# Patient Record
Sex: Male | Born: 1965
Health system: Southern US, Community
[De-identification: ages and names within clinical notes are randomized; demographics above are authoritative.]

## PROBLEM LIST (undated history)

## (undated) DIAGNOSIS — M1990 Unspecified osteoarthritis, unspecified site: Secondary | ICD-10-CM

## (undated) DIAGNOSIS — H9190 Unspecified hearing loss, unspecified ear: Secondary | ICD-10-CM

## (undated) DIAGNOSIS — H269 Unspecified cataract: Secondary | ICD-10-CM

## (undated) DIAGNOSIS — K219 Gastro-esophageal reflux disease without esophagitis: Secondary | ICD-10-CM

## (undated) DIAGNOSIS — E785 Hyperlipidemia, unspecified: Secondary | ICD-10-CM

## (undated) HISTORY — DX: Hyperlipidemia, unspecified: E78.5

## (undated) HISTORY — DX: Unspecified osteoarthritis, unspecified site: M19.90

## (undated) HISTORY — PX: OTHER SURGICAL HISTORY: SHX169

## (undated) HISTORY — DX: Unspecified cataract: H26.9

## (undated) HISTORY — DX: Unspecified hearing loss, unspecified ear: H91.90

## (undated) HISTORY — DX: Gastro-esophageal reflux disease without esophagitis: K21.9

## (undated) HISTORY — PX: HAND SURGERY: SHX662

---

## 1999-11-17 ENCOUNTER — Encounter: Payer: Self-pay | Admitting: Emergency Medicine

## 1999-11-17 ENCOUNTER — Observation Stay (HOSPITAL_COMMUNITY): Admission: EM | Admit: 1999-11-17 | Discharge: 1999-11-18 | Payer: Self-pay | Admitting: Emergency Medicine

## 2008-11-15 DEATH — deceased

## 2012-09-02 ENCOUNTER — Other Ambulatory Visit (HOSPITAL_COMMUNITY): Payer: Self-pay | Admitting: Chiropractic Medicine

## 2012-09-02 ENCOUNTER — Ambulatory Visit (HOSPITAL_COMMUNITY)
Admission: RE | Admit: 2012-09-02 | Discharge: 2012-09-02 | Disposition: A | Discharge status: Home / Self Care | Payer: Self-pay | Source: Ambulatory Visit | Attending: Chiropractic Medicine | Admitting: Chiropractic Medicine

## 2012-09-02 DIAGNOSIS — R52 Pain, unspecified: Secondary | ICD-10-CM

## 2012-09-02 DIAGNOSIS — R079 Chest pain, unspecified: Secondary | ICD-10-CM | POA: Insufficient documentation

## 2014-02-26 IMAGING — CR DG RIBS W/ CHEST 3+V*R*
5 series · 5 of 5 positions shown · non-contrast
Comparison: None.

CLINICAL DATA: Right upper posterior rib pain for 1 month, no
history of injury

RIGHT RIBS AND CHEST - 3+ VIEW

[w chest pa]
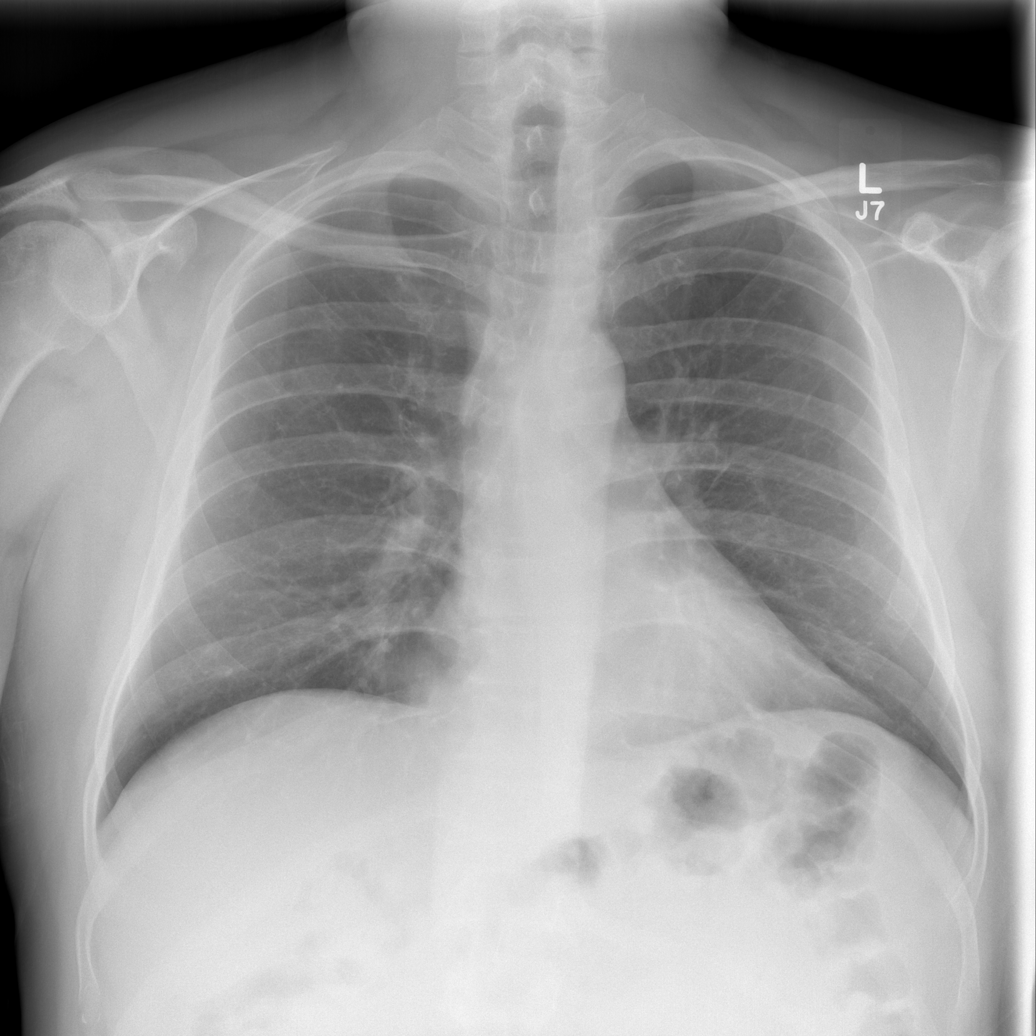

[w ribs ap/pa upper right * (1 of 2)]
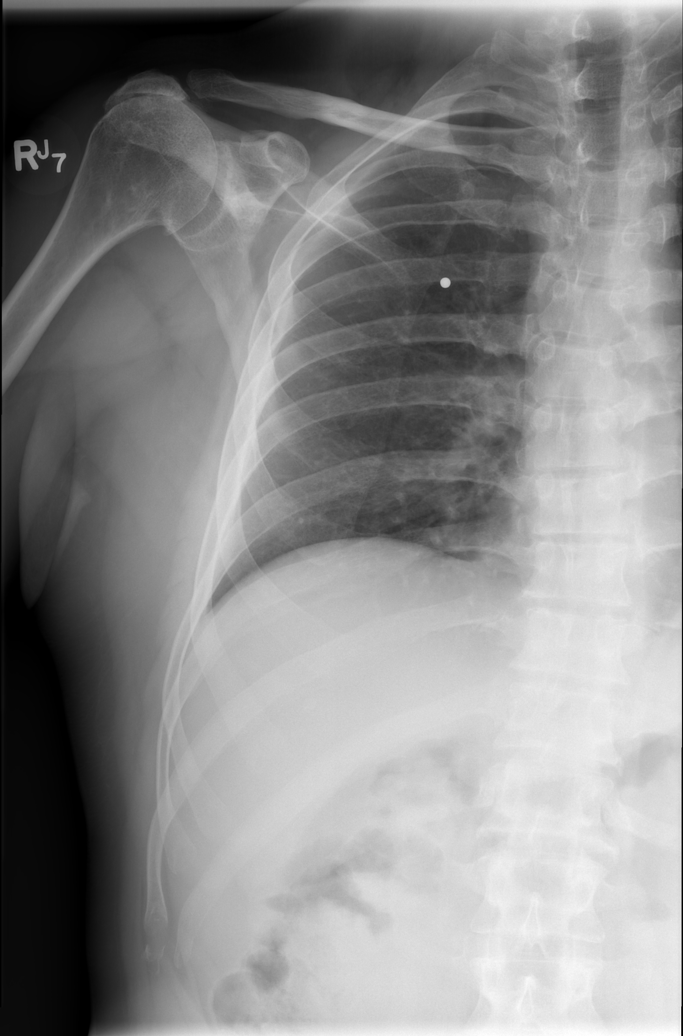

[w ribs ap/pa upper right * (2 of 2)]
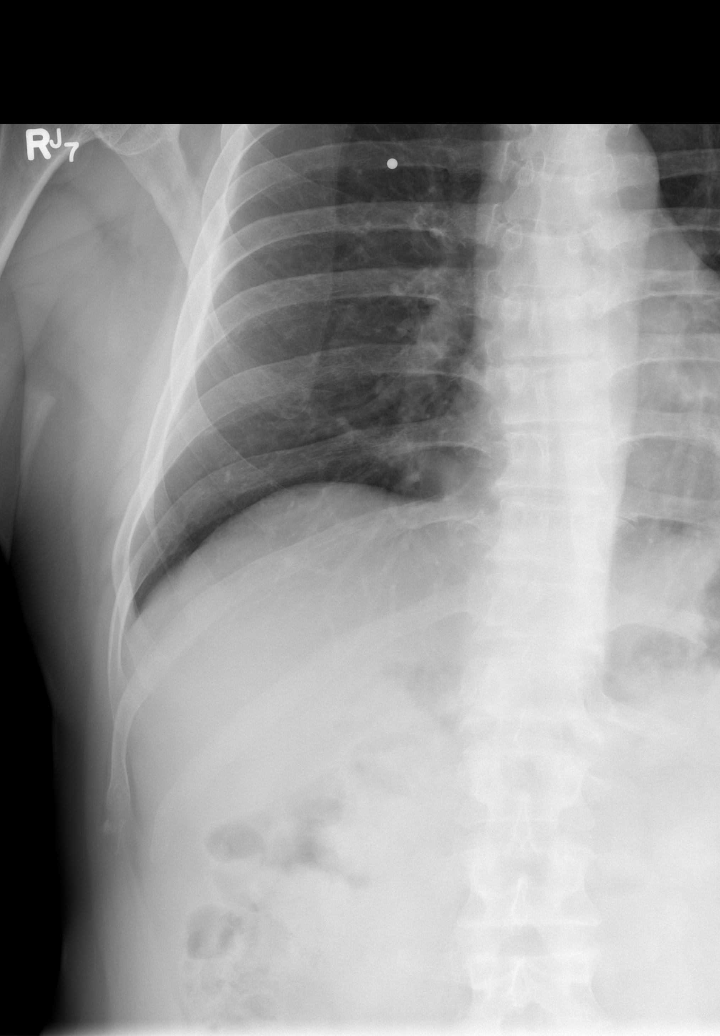

[w ribs oblique right * (1 of 2)]
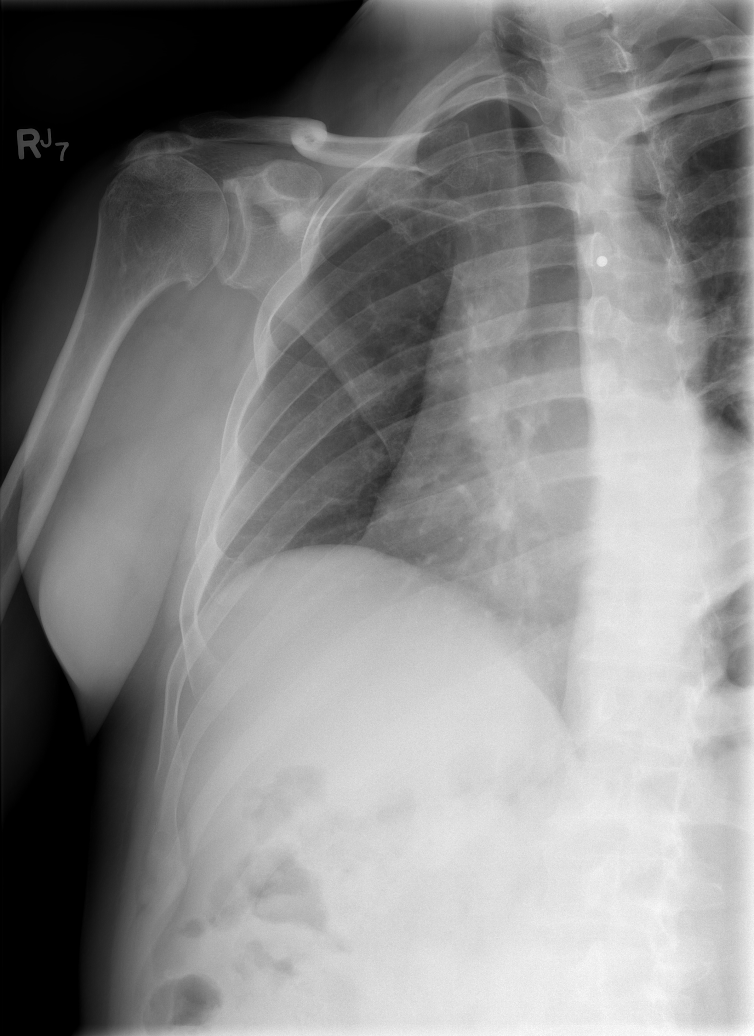

[w ribs oblique right * (2 of 2)]
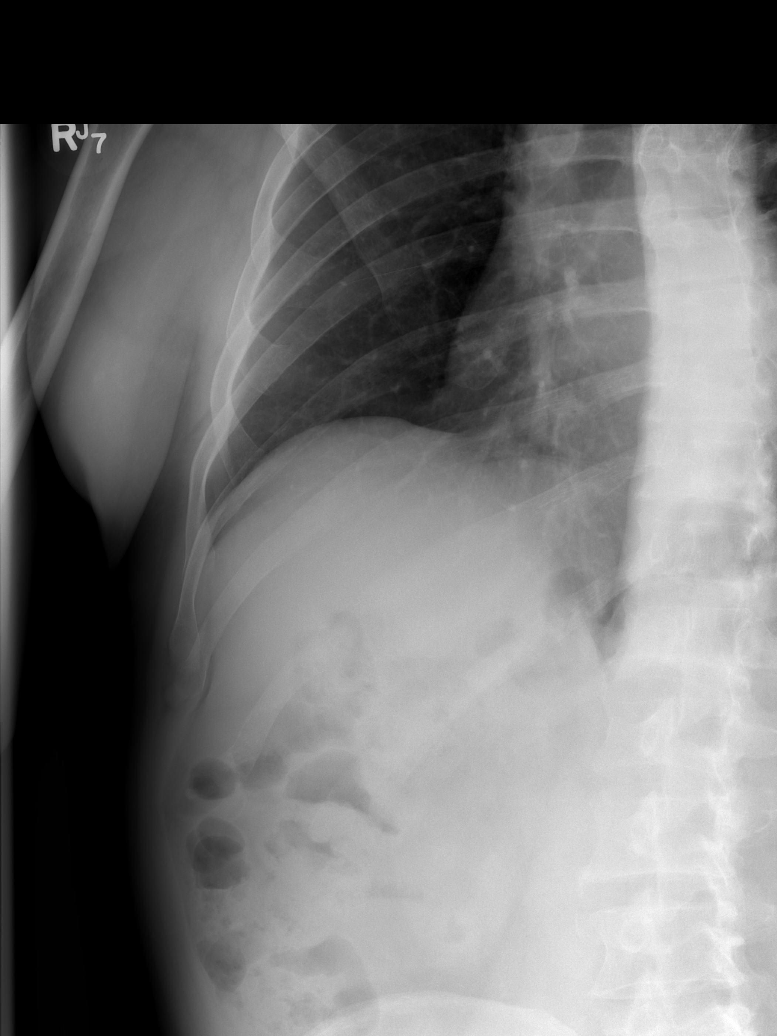

[5 of 5 positions shown; findings below may reference images not displayed]

FINDINGS: The lungs are clear.  Mediastinal contours appear normal.
The heart is within normal limits in size.

Right rib detail films show no acute abnormality of the ribs.
There is significant degenerative change however involving the
right shoulder with probable chronic rotator cuff disease.
IMPRESSION: 1.  No active lung disease.
2.  Negative right rib detail.
3.  Findings consistent with chronic rotator cuff disease on the
right.

## 2015-02-25 DIAGNOSIS — M25531 Pain in right wrist: Secondary | ICD-10-CM | POA: Diagnosis not present

## 2015-02-25 DIAGNOSIS — S6991XA Unspecified injury of right wrist, hand and finger(s), initial encounter: Secondary | ICD-10-CM | POA: Diagnosis not present

## 2015-07-05 DIAGNOSIS — R51 Headache: Secondary | ICD-10-CM | POA: Diagnosis not present

## 2015-07-05 DIAGNOSIS — L03317 Cellulitis of buttock: Secondary | ICD-10-CM | POA: Diagnosis not present

## 2015-07-05 DIAGNOSIS — R7303 Prediabetes: Secondary | ICD-10-CM | POA: Diagnosis not present

## 2015-08-09 DIAGNOSIS — L03818 Cellulitis of other sites: Secondary | ICD-10-CM | POA: Diagnosis not present

## 2016-01-25 DIAGNOSIS — S43402A Unspecified sprain of left shoulder joint, initial encounter: Secondary | ICD-10-CM | POA: Diagnosis not present

## 2016-01-25 DIAGNOSIS — S4992XA Unspecified injury of left shoulder and upper arm, initial encounter: Secondary | ICD-10-CM | POA: Diagnosis not present

## 2017-02-28 DIAGNOSIS — H903 Sensorineural hearing loss, bilateral: Secondary | ICD-10-CM | POA: Insufficient documentation

## 2021-08-30 ENCOUNTER — Encounter: Payer: Self-pay | Admitting: Family Medicine

## 2021-08-30 ENCOUNTER — Ambulatory Visit (INDEPENDENT_AMBULATORY_CARE_PROVIDER_SITE_OTHER): Payer: Medicare Other | Admitting: Family Medicine

## 2021-08-30 VITALS — BP 134/92 | HR 66 | Temp 96.7°F | Ht 71.5 in | Wt 215.4 lb

## 2021-08-30 DIAGNOSIS — Z96642 Presence of left artificial hip joint: Secondary | ICD-10-CM

## 2021-08-30 DIAGNOSIS — H903 Sensorineural hearing loss, bilateral: Secondary | ICD-10-CM

## 2021-08-30 DIAGNOSIS — R102 Pelvic and perineal pain: Secondary | ICD-10-CM | POA: Diagnosis not present

## 2021-08-30 DIAGNOSIS — N528 Other male erectile dysfunction: Secondary | ICD-10-CM

## 2021-08-30 DIAGNOSIS — W38XXXS Explosion and rupture of other specified pressurized devices, sequela: Secondary | ICD-10-CM | POA: Insufficient documentation

## 2021-08-30 DIAGNOSIS — E782 Mixed hyperlipidemia: Secondary | ICD-10-CM

## 2021-08-30 DIAGNOSIS — Z9889 Other specified postprocedural states: Secondary | ICD-10-CM | POA: Insufficient documentation

## 2021-08-30 DIAGNOSIS — Q549 Hypospadias, unspecified: Secondary | ICD-10-CM

## 2021-08-30 DIAGNOSIS — F5101 Primary insomnia: Secondary | ICD-10-CM

## 2021-08-30 DIAGNOSIS — M159 Polyosteoarthritis, unspecified: Secondary | ICD-10-CM

## 2021-08-30 DIAGNOSIS — Z809 Family history of malignant neoplasm, unspecified: Secondary | ICD-10-CM | POA: Insufficient documentation

## 2021-08-30 DIAGNOSIS — E1169 Type 2 diabetes mellitus with other specified complication: Secondary | ICD-10-CM | POA: Insufficient documentation

## 2021-08-30 DIAGNOSIS — R0683 Snoring: Secondary | ICD-10-CM

## 2021-08-30 DIAGNOSIS — Z779 Other contact with and (suspected) exposures hazardous to health: Secondary | ICD-10-CM | POA: Insufficient documentation

## 2021-08-30 HISTORY — DX: Hypospadias, unspecified: Q54.9

## 2021-08-30 HISTORY — DX: Presence of left artificial hip joint: Z96.642

## 2021-08-30 HISTORY — DX: Explosion and rupture of other specified pressurized devices, sequela: W38.XXXS

## 2021-08-30 HISTORY — DX: Other specified postprocedural states: Z98.890

## 2021-08-30 HISTORY — DX: Other contact with and (suspected) exposures hazardous to health: Z77.9

## 2021-08-30 HISTORY — DX: Pelvic and perineal pain: R10.2

## 2021-08-30 HISTORY — DX: Family history of malignant neoplasm, unspecified: Z80.9
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
## 2021-08-30 NOTE — Assessment & Plan Note (Signed)
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

## 2021-08-30 NOTE — Assessment & Plan Note (Signed)
Irritation, drainage, chronic ?History of trial of antifungals without improvement ?Follow-up in 1 month ?

## 2021-08-30 NOTE — Assessment & Plan Note (Signed)
Leading to traumatic surgical hands ?With ongoing hand pain ?Controlled on CBD OTC ?

## 2021-08-30 NOTE — Assessment & Plan Note (Signed)
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

## 2021-08-30 NOTE — Assessment & Plan Note (Signed)
Currently on rosuvastatin 10 mg ?Denies refills at this time ?Check lipid panel in 1 month ?

## 2021-08-30 NOTE — Assessment & Plan Note (Signed)
Associated with "stability" and other urologic and ED dysfunction ?Report no urinary signs associated with infection ?Follow-up at next appointment ?

## 2021-08-30 NOTE — Patient Instructions (Addendum)
It was a pleasure to meet you today Scott Shannon. ?We are referring to oncology because of exposure to cancer causing agents at  [place] ?Please follow-up 1 month so I can discuss your ongoing pain and cholesterol issues. ?

## 2021-08-30 NOTE — Assessment & Plan Note (Signed)
Says he was snoring, elevated BMI, concern for possible OSA ?Follow-up in 1 month to further explore ?

## 2021-09-06 ENCOUNTER — Ambulatory Visit: Payer: Medicare Other | Admitting: Family Medicine

## 2021-09-09 ENCOUNTER — Telehealth: Payer: Self-pay | Admitting: Family Medicine

## 2021-09-09 NOTE — Telephone Encounter (Signed)
Pt 's referral [phone] has closed the request. They do not see anyone with this Diagnosis.

## 2021-09-14 ENCOUNTER — Encounter: Payer: Self-pay | Admitting: Family Medicine

## 2021-09-14 ENCOUNTER — Ambulatory Visit (INDEPENDENT_AMBULATORY_CARE_PROVIDER_SITE_OTHER): Payer: Medicare Other | Admitting: Family Medicine

## 2021-09-14 VITALS — BP 153/87 | HR 82 | Temp 96.7°F | Ht 71.0 in | Wt 212.0 lb

## 2021-09-14 DIAGNOSIS — R102 Pelvic and perineal pain: Secondary | ICD-10-CM

## 2021-09-14 DIAGNOSIS — R079 Chest pain, unspecified: Secondary | ICD-10-CM

## 2021-09-14 DIAGNOSIS — L304 Erythema intertrigo: Secondary | ICD-10-CM

## 2021-09-14 DIAGNOSIS — R002 Palpitations: Secondary | ICD-10-CM | POA: Diagnosis not present

## 2021-09-14 LAB — CBC WITH DIFFERENTIAL/PLATELET
Basophils Absolute: 0.1 10*3/uL (ref 0.0–0.1)
Basophils Relative: 0.9 % (ref 0.0–3.0)
Eosinophils Absolute: 0.1 10*3/uL (ref 0.0–0.7)
Eosinophils Relative: 1.6 % (ref 0.0–5.0)
HCT: 46.9 % (ref 39.0–52.0)
Hemoglobin: 15.8 g/dL (ref 13.0–17.0)
Lymphocytes Relative: 27.4 % (ref 12.0–46.0)
Lymphs Abs: 2.4 10*3/uL (ref 0.7–4.0)
MCHC: 33.7 g/dL (ref 30.0–36.0)
MCV: 92.4 fl (ref 78.0–100.0)
Monocytes Absolute: 0.7 10*3/uL (ref 0.1–1.0)
Monocytes Relative: 7.9 % (ref 3.0–12.0)
Neutro Abs: 5.5 10*3/uL (ref 1.4–7.7)
Neutrophils Relative %: 62.2 % (ref 43.0–77.0)
Platelets: 286 10*3/uL (ref 150.0–400.0)
RBC: 5.08 Mil/uL (ref 4.22–5.81)
RDW: 13 % (ref 11.5–15.5)
WBC: 8.8 10*3/uL (ref 4.0–10.5)

## 2021-09-14 LAB — BASIC METABOLIC PANEL
BUN: 17 mg/dL (ref 6–23)
CO2: 23 mEq/L (ref 19–32)
Calcium: 10.2 mg/dL (ref 8.4–10.5)
Chloride: 100 mEq/L (ref 96–112)
Creatinine, Ser: 1.32 mg/dL (ref 0.40–1.50)
GFR: 60.25 mL/min (ref >60.00)
Glucose, Bld: 143 mg/dL — ABNORMAL HIGH (ref 70–99)
Potassium: 4.2 mEq/L (ref 3.5–5.1)
Sodium: 133 mEq/L — ABNORMAL LOW (ref 135–145)

## 2021-09-14 LAB — LIPID PANEL
Cholesterol: 199 mg/dL (ref 0–200)
HDL: 52 mg/dL (ref >39.00)
LDL Cholesterol: 115 mg/dL — ABNORMAL HIGH (ref 0–99)
NonHDL: 146.97
Total CHOL/HDL Ratio: 4
Triglycerides: 160 mg/dL — ABNORMAL HIGH (ref 0.0–149.0)
VLDL: 32 mg/dL (ref 0.0–40.0)

## 2021-09-14 LAB — HEMOGLOBIN A1C: Hgb A1c MFr Bld: 6.8 % — ABNORMAL HIGH (ref 4.6–6.5)

## 2021-09-14 MED ORDER — ZINC OXIDE 30 % EX OINT: 1.0000 g | TOPICAL_OINTMENT | Freq: Every day | CUTANEOUS | 0 refills | Status: DC

## 2021-09-14 MED ORDER — FLUCONAZOLE 150 MG PO TABS: 150.0000 mg | ORAL_TABLET | Freq: Every day | ORAL | 0 refills | Status: AC

## 2021-09-14 NOTE — Patient Instructions (Addendum)
EKG did not show anything consistent with heart attack. We are checking basic labs to look at causes of palpitations and heart.  Follow-up if no improvement, go to the emergency department if worsening or develops again or if you have shortness of breath For the raw area in your gluteal area, try over-the-counter zinc oxide barrier cream and Diflucan pill. Follow-up in 1 month for blood pressure.

## 2021-09-14 NOTE — Progress Notes (Signed)
   Scott Shannon. is a 56 y.o. male who presents today for an office visit.  Assessment/Plan:  New/Acute Problems: Gluteal intertrigo Resistant to topical antifungals Trial Diflucan 150 mg daily for 1 week Barrier cream Follow-up as needed  Left-sided chest pain with palpitations EKG shows RBBB, but no ST changes Risk factors include hypertension Has not had screening labs, Restratification labs CMP, lipid, hemoglobin A1c Return precautions discussed    Subjective:  HPI:  Chest Pain: Patient complains of chest pain. Onset was 3 weeks ago, with unchanged course since that time. The patient describes the pain as intermittent, left-sided dull in nature, does not radiate. Patient rates pain as a 4/10 in intensity.  Associated symptoms are palpitations.  Denies shortness of breath.  Aggravating factors are none.  Alleviating factors are: none. Patient's cardiac risk factors are advanced age (older than 28 for men, 37 for women) and hypertension.  Patient's risk factors for DVT/PE: none. Previous cardiac testing: none.  Patient reports perineal/perianal irritation and rash.  It is made worse with sweating and friction contact.  At one time he did have an abscess in this area that he had it lanced, this was few years ago.  Patient has been treated with topical antifungals and reports that this did not help.  Denies any pain like this or drainage.  Denies any fevers or chills.        Objective:  Physical Exam: BP (!) 153/87 (BP Location: Left Arm, Cuff Size: Normal)   Pulse 82   Temp (!) 96.7 F (35.9 C) (Temporal)   Ht '5\' 11"'$  (1.803 m)   Wt 212 lb (96.2 kg)   SpO2 97%   BMI 29.57 kg/m   Gen: No acute distress, resting comfortably CV: Regular rate and rhythm with no murmurs appreciated Pulm: Normal work of breathing, clear to auscultation bilaterally with no crackles, wheezes, or rhonchi  Skin: Perineal and gluteal erythema bilateral cheeks with "kissing lesions", no satellite  lesions or abscesses or drainage noted, mildly tender to palpation Neuro: Grossly normal, moves all extremities Psych: Normal affect and thought content       09/14/2021 4:52 PM

## 2021-09-14 NOTE — Telephone Encounter (Signed)
Working with referral coordinator in the cancer ctr to see which location I should send to.

## 2021-09-15 ENCOUNTER — Telehealth: Payer: Self-pay | Admitting: Oncology

## 2021-09-15 LAB — THYROID PANEL WITH TSH
Free Thyroxine Index: 2.3 (ref 1.4–3.8)
T3 Uptake: 33 % (ref 22–35)
T4, Total: 7.1 ug/dL (ref 4.9–10.5)
TSH: 3.08 mIU/L (ref 0.40–4.50)

## 2021-09-15 NOTE — Telephone Encounter (Signed)
Per Dr Grayland Ormond notes We do not see this diagnosis.  This referral has already been denied in [place] and at [facility].  Please decline the referral.  To keep our messaging consistent please tell the referring office "upon review, to support Mr. Demir having a reason to see oncology/hematology, the recommendation is that he have his age appropriate cancer screenings, if not already completed.  Purpose being to perhaps unveil a clinical reason for referral.  Second, proposing perhaps basic labs (last labs in Epic we found were 2018) if patient is agreeable."

## 2021-09-27 ENCOUNTER — Other Ambulatory Visit: Payer: Self-pay | Admitting: Family Medicine

## 2021-09-27 ENCOUNTER — Ambulatory Visit (INDEPENDENT_AMBULATORY_CARE_PROVIDER_SITE_OTHER): Payer: Medicare Other | Admitting: Family Medicine

## 2021-09-27 ENCOUNTER — Telehealth: Payer: Self-pay

## 2021-09-27 ENCOUNTER — Encounter: Payer: Self-pay | Admitting: Family Medicine

## 2021-09-27 ENCOUNTER — Encounter: Payer: Self-pay | Admitting: Gastroenterology

## 2021-09-27 VITALS — BP 126/82 | HR 74 | Temp 97.5°F | Wt 213.4 lb

## 2021-09-27 DIAGNOSIS — E782 Mixed hyperlipidemia: Secondary | ICD-10-CM | POA: Diagnosis not present

## 2021-09-27 DIAGNOSIS — R972 Elevated prostate specific antigen [PSA]: Secondary | ICD-10-CM

## 2021-09-27 DIAGNOSIS — E871 Hypo-osmolality and hyponatremia: Secondary | ICD-10-CM | POA: Insufficient documentation

## 2021-09-27 DIAGNOSIS — R739 Hyperglycemia, unspecified: Secondary | ICD-10-CM | POA: Diagnosis not present

## 2021-09-27 DIAGNOSIS — Z79899 Other long term (current) drug therapy: Secondary | ICD-10-CM | POA: Diagnosis not present

## 2021-09-27 DIAGNOSIS — Z809 Family history of malignant neoplasm, unspecified: Secondary | ICD-10-CM

## 2021-09-27 DIAGNOSIS — Z125 Encounter for screening for malignant neoplasm of prostate: Secondary | ICD-10-CM | POA: Diagnosis not present

## 2021-09-27 DIAGNOSIS — Z122 Encounter for screening for malignant neoplasm of respiratory organs: Secondary | ICD-10-CM

## 2021-09-27 DIAGNOSIS — Z1211 Encounter for screening for malignant neoplasm of colon: Secondary | ICD-10-CM

## 2021-09-27 HISTORY — DX: Hypo-osmolality and hyponatremia: E87.1

## 2021-09-27 LAB — LIPID PANEL
Cholesterol: 181 mg/dL (ref 0–200)
HDL: 53.2 mg/dL (ref >39.00)
LDL Cholesterol: 103 mg/dL — ABNORMAL HIGH (ref 0–99)
NonHDL: 128.16
Total CHOL/HDL Ratio: 3
Triglycerides: 128 mg/dL (ref 0.0–149.0)
VLDL: 25.6 mg/dL (ref 0.0–40.0)

## 2021-09-27 LAB — COMPREHENSIVE METABOLIC PANEL
ALT: 29 U/L (ref 0–53)
AST: 18 U/L (ref 0–37)
Albumin: 4.4 g/dL (ref 3.5–5.2)
Alkaline Phosphatase: 92 U/L (ref 39–117)
BUN: 16 mg/dL (ref 6–23)
CO2: 25 mEq/L (ref 19–32)
Calcium: 9.6 mg/dL (ref 8.4–10.5)
Chloride: 102 mEq/L (ref 96–112)
Creatinine, Ser: 1.3 mg/dL (ref 0.40–1.50)
GFR: 61.35 mL/min (ref >60.00)
Glucose, Bld: 128 mg/dL — ABNORMAL HIGH (ref 70–99)
Potassium: 4.5 mEq/L (ref 3.5–5.1)
Sodium: 136 mEq/L (ref 135–145)
Total Bilirubin: 0.5 mg/dL (ref 0.2–1.2)
Total Protein: 6.7 g/dL (ref 6.0–8.3)

## 2021-09-27 LAB — PSA: PSA: 5.58 ng/mL — ABNORMAL HIGH (ref 0.10–4.00)

## 2021-09-27 NOTE — Assessment & Plan Note (Signed)
Mild Repeat CMP

## 2021-09-27 NOTE — Telephone Encounter (Addendum)
Entered in error. Please disregard.   ----- Message from Bonnita Hollow, MD sent at 09/27/2021 12:02 PM EDT ----- Please call pharmacy and find out dosing of metformin

## 2021-09-27 NOTE — Patient Instructions (Signed)
It was very nice to see you today!  We are checking routine labs today to recheck electrolytes, blood sugar, kidney and liver function, and prostate screening We are referring you to get colonoscopy to look for colon cancer We are referring you to get CT scan of the lungs to look for lung cancer  Take care, Dr. Grandville Silos  PLEASE NOTE:  If you had any lab tests please let us know if you have not heard back within a few days. You may see your results on mychart before we have a chance to review them but we will give you a call once they are reviewed by [place]. If we ordered any referrals today, please let us know if you have not heard from their office within the next week.   Please try these tips to maintain a healthy lifestyle:  Eat at least 3 REAL meals and 1-2 snacks per day.  Aim for no more than 5 hours between eating.  If you eat breakfast, please do so within one hour of getting up.   Each meal should contain half fruits/vegetables, one quarter protein, and one quarter carbs (no bigger than a computer mouse)  Cut down on sweet beverages. This includes juice, soda, and sweet tea.   Drink at least 1 glass of water with each meal and aim for at least 8 glasses per day  Exercise at least 150 minutes every week.

## 2021-09-27 NOTE — Telephone Encounter (Signed)
Fyi, I spoke with patient and advised him of annotation below and contact information. I will be sending number in mychart message since patient was driving.

## 2021-09-27 NOTE — Telephone Encounter (Signed)
-----   Message from Bonnita Hollow, MD sent at 09/27/2021 12:02 PM EDT ----- Please call pharmacy and find out dosing of metformin

## 2021-09-27 NOTE — Telephone Encounter (Signed)
-----   Message from [facility] sent at 09/27/2021 10:53 AM EDT ----- Good Morning,   The referral was sent to  [facility]   [facility] [address] [place], [place]  [phone]  [phone] fax IF patient has not heard they can contact them directly to schedule.       ----- Message ----- From: Renaee Munda, CMA Sent: 09/27/2021  10:41 AM EDT To: Margrett Rud  Hi Sherri any updates on this referral? ----- Message ----- From: Bonnita Hollow, MD Sent: 09/27/2021  10:04 AM EDT To: Renaee Munda, CMA  Can you please check on oncology referral to [facility]?

## 2021-09-27 NOTE — Assessment & Plan Note (Signed)
Oncology referred routine cancer screenings due to history of carcinogenic exposure Lung cancer screening, colonoscopy, PSA ordered today

## 2021-09-27 NOTE — Telephone Encounter (Deleted)
Entered in error. Please disregard

## 2021-09-27 NOTE — Assessment & Plan Note (Signed)
Seen on nonfasting BMP and hemoglobin A1c Patient fasting today, repeat serum glucose with CMP

## 2021-09-27 NOTE — Assessment & Plan Note (Addendum)
Given patient fasting and has been on statin therapy We will repeat lipid panel Given started on statin therapy we will check LFTs with CMP

## 2021-09-27 NOTE — Progress Notes (Signed)
   Scott Shannon. is a 56 y.o. male who presents today for an office visit.  Assessment/Plan:  New/Acute Problems:   Chronic Problems Addressed Today: Family history of cancer Oncology referred routine cancer screenings due to history of carcinogenic exposure Lung cancer screening, colonoscopy, PSA ordered today  Mixed hyperlipidemia Given patient fasting and has been on statin therapy We will repeat lipid panel Given started on statin therapy we will check LFTs with CMP  Hyperglycemia Seen on nonfasting BMP and hemoglobin A1c Patient fasting today, repeat serum glucose with CMP  Hyponatremia Mild Repeat CMP  Intertrigo improved Continue barrier cream    Subjective:  HPI:  Patient presents in follow-up for labs.  Patient did have some mild hyponatremia, hyperglycemia, hemoglobin A1c was consistent with diabetes at 6.8.  Patient also has some hyperlipidemia.  Patient is fasting today.  He has been taking statin medication rosuvastatin 10 mg without issue.  Patient has a history of perineal rash versus intertrigo.  Patient was prescribed barrier cream and Diflucan.  Does report some improvement although it is still present.  Patient has a history of smoking with 60-pack-year history.  Patient with history of cancers in family, associated with possible exposure to carcinogen and camp [place].  Patient interested in screening for colon cancer.  Discussed given that we are screening for multiple cancers, recommended prostate specific antigen blood test.  Patient is supportive of this       Objective:  Physical Exam: BP 126/82 (BP Location: Left Arm, Patient Position: Sitting, Cuff Size: Large)   Pulse 74   Temp (!) 97.5 F (36.4 C) (Temporal)   Wt 213 lb 6.4 oz (96.8 kg)   SpO2 98%   BMI 29.76 kg/m   Gen: No acute distress, resting comfortably CV: Regular rate and rhythm with no murmurs appreciated Pulm: Normal work of breathing, clear to auscultation bilaterally  with no crackles, wheezes, or rhonchi Neuro: Grossly normal, moves all extremities Psych: Normal affect and thought content      Alesia Banda, MD, MS

## 2021-09-28 ENCOUNTER — Telehealth: Payer: Self-pay | Admitting: Family Medicine

## 2021-09-28 NOTE — Telephone Encounter (Signed)
Pt is needing a call back concerning his most recent lab results. Please advise [phone]

## 2021-09-28 NOTE — Telephone Encounter (Signed)
Contacted patient and provided results. Patient verbalized understanding.  Patient scheduled on 01/03/2022 at 2:00 pm with PCP.

## 2021-10-12 ENCOUNTER — Ambulatory Visit: Payer: Medicare Other | Admitting: Family Medicine

## 2021-10-12 ENCOUNTER — Telehealth: Payer: Self-pay

## 2021-10-12 NOTE — Telephone Encounter (Signed)
Left patient a detailed voice message to return call to office regarding 11 am appointment today with Dr. Grandville Silos. Patient has already come in for follow up this month and has appointment scheduled on 01/03/2022 at 2:00 pm with PCP

## 2021-10-17 ENCOUNTER — Ambulatory Visit (AMBULATORY_SURGERY_CENTER): Payer: Self-pay | Admitting: *Deleted

## 2021-10-17 VITALS — Ht 71.0 in | Wt 219.0 lb

## 2021-10-17 DIAGNOSIS — Z1211 Encounter for screening for malignant neoplasm of colon: Secondary | ICD-10-CM

## 2021-10-17 MED ORDER — NA SULFATE-K SULFATE-MG SULF 17.5-3.13-1.6 GM/177ML PO SOLN: 1.0000 | Freq: Once | ORAL | 0 refills | Status: AC

## 2021-10-17 NOTE — Progress Notes (Signed)
No egg or soy allergy known to patient  No issues known to pt with past sedation with any surgeries or procedures Patient denies ever being told they had issues or difficulty with intubation  No FH of Malignant Hyperthermia Pt is not on diet pills Pt is not on  home 02  Pt is not on blood thinners  Pt denies issues with constipation  No A fib or A flutter Have any cardiac testing pending--  SUPREP Coupon to pt in PV today , Code to Pharmacy and  NO PA's for preps discussed with pt In PV today  Discussed with pt there will be an out-of-pocket cost for prep and that varies from $0 to 70 +  dollars - pt verbalized understanding  Pt instructed to use Singlecare.com or GoodRx for a price reduction on prep

## 2021-10-21 ENCOUNTER — Inpatient Hospital Stay: Admission: RE | Admit: 2021-10-21 | Payer: Medicare Other | Source: Ambulatory Visit

## 2021-11-14 ENCOUNTER — Encounter: Payer: Self-pay | Admitting: Gastroenterology

## 2021-11-14 ENCOUNTER — Ambulatory Visit (AMBULATORY_SURGERY_CENTER): Payer: Medicare Other | Admitting: Gastroenterology

## 2021-11-14 VITALS — BP 129/78 | HR 45 | Temp 97.3°F | Resp 14 | Ht 71.0 in | Wt 219.0 lb

## 2021-11-14 DIAGNOSIS — D123 Benign neoplasm of transverse colon: Secondary | ICD-10-CM

## 2021-11-14 DIAGNOSIS — Z1211 Encounter for screening for malignant neoplasm of colon: Secondary | ICD-10-CM | POA: Diagnosis not present

## 2021-11-14 DIAGNOSIS — D127 Benign neoplasm of rectosigmoid junction: Secondary | ICD-10-CM | POA: Diagnosis not present

## 2021-11-14 DIAGNOSIS — D125 Benign neoplasm of sigmoid colon: Secondary | ICD-10-CM | POA: Diagnosis not present

## 2021-11-14 MED ORDER — SODIUM CHLORIDE 0.9 % IV SOLN: 500.0000 mL | Freq: Once | INTRAVENOUS | Status: DC

## 2021-11-14 NOTE — Patient Instructions (Signed)
Read all of the handouts given to you by your recovery room nurse.  YOU HAD AN ENDOSCOPIC PROCEDURE TODAY AT THE [facility]:   Refer to the procedure report that was given to you for any specific questions about what was found during the examination.  If the procedure report does not answer your questions, please call your gastroenterologist to clarify.  If you requested that your care partner not be given the details of your procedure findings, then the procedure report has been included in a sealed envelope for you to review at your convenience later.  YOU SHOULD EXPECT: Some feelings of bloating in the abdomen. Passage of more gas than usual.  Walking can help get rid of the air that was put into your GI tract during the procedure and reduce the bloating. If you had a lower endoscopy (such as a colonoscopy or flexible sigmoidoscopy) you may notice spotting of blood in your stool or on the toilet paper. If you underwent a bowel prep for your procedure, you may not have a normal bowel movement for a few days.  Please Note:  You might notice some irritation and congestion in your nose or some drainage.  This is from the oxygen used during your procedure.  There is no need for concern and it should clear up in a day or so.  SYMPTOMS TO REPORT IMMEDIATELY:  Following lower endoscopy (colonoscopy or flexible sigmoidoscopy):  Excessive amounts of blood in the stool  Significant tenderness or worsening of abdominal pains  Swelling of the abdomen that is new, acute  Fever of 100F or higher   For urgent or emergent issues, a gastroenterologist can be reached at any hour by calling [phone]. Do not use MyChart messaging for urgent concerns.    DIET:  We do recommend a small meal at first, but then you may proceed to your regular diet.  Drink plenty of fluids but you should avoid alcoholic beverages for 24 hours.  ACTIVITY:  You should plan to take it easy for the rest of today and  you should NOT DRIVE or use heavy machinery until tomorrow (because of the sedation medicines used during the test).    FOLLOW UP: Our staff will call the number listed on your records the next business day following your procedure.  We will call around 7:15- 8:00 am to check on you and address any questions or concerns that you may have regarding the information given to you following your procedure. If we do not reach you, we will leave a message.  If you develop any symptoms (ie: fever, flu-like symptoms, shortness of breath, cough etc.) before then, please call [phone].  If you test positive for Covid 19 in the 2 weeks post procedure, please call and report this information to us.    If any biopsies were taken you will be contacted by phone or by letter within the next 1-3 weeks.  Please call us at [phone] if you have not heard about the biopsies in 3 weeks.    SIGNATURES/CONFIDENTIALITY: You and/or your care partner have signed paperwork which will be entered into your electronic medical record.  These signatures attest to the fact that that the information above on your After Visit Summary has been reviewed and is understood.  Full responsibility of the confidentiality of this discharge information lies with you and/or your care-partner.  

## 2021-11-14 NOTE — Progress Notes (Signed)
PT taken to PACU. Monitors in place. VSS. Report given to RN. 

## 2021-11-14 NOTE — Progress Notes (Signed)
[facility] History and Physical   Primary Care Physician:  Bonnita Hollow, MD   Reason for Procedure:   Colon cancer screening  Plan:    Screening colonoscopy     HPI: Scott Shannon. is a 56 y.o. male undergoing initial average risk screening colonoscopy.  He has no family history of colon cancer and no chronic GI symptoms.    Past Medical History:  Diagnosis Date   Arthritis    BILATERAL   Cataract    BILATERAL   Deaf    COCHLEAR IMPLANT REQUIRED   GERD (gastroesophageal reflux disease)    Hyperlipidemia     Past Surgical History:  Procedure Laterality Date   HAND SURGERY Bilateral    HEAD SUTURES     HIP REPLACEMENT Left    URINARY TRACT SURGERY     AS A CHILD    Prior to Admission medications   Medication Sig Start Date End Date Taking? Authorizing Provider  Multiple Vitamin (MULTIVITAMIN ADULT PO) Take by mouth daily. TAKE ONE DAILY   Yes [provider]  rosuvastatin (CRESTOR) 10 MG tablet Take 10 mg by mouth daily. 08/22/21  Yes [provider]  Zinc Oxide 30 % OINT Apply 1 g topically daily. 09/14/21   Bonnita Hollow, MD    Current Outpatient Medications  Medication Sig Dispense Refill   Multiple Vitamin (MULTIVITAMIN ADULT PO) Take by mouth daily. TAKE ONE DAILY     rosuvastatin (CRESTOR) 10 MG tablet Take 10 mg by mouth daily.     Zinc Oxide 30 % OINT Apply 1 g topically daily. 57 g 0   Current Facility-Administered Medications  Medication Dose Route Frequency Provider Last Rate Last Admin   0.9 %  sodium chloride infusion  500 mL Intravenous Once Daryel November, MD        Allergies as of 11/14/2021 - Review Complete 11/14/2021  Allergen Reaction Noted   Amoxicillin Nausea Only 08/30/2021    Family History  Problem Relation Age of Onset   Colon polyps Mother    Colon polyps Father    Cancer Father    Colon cancer Neg Hx    Crohn's disease Neg Hx    Esophageal cancer Neg Hx    Rectal cancer Neg Hx     Stomach cancer Neg Hx     Social History   Socioeconomic History   Marital status: Married    Spouse name: Not on file   Number of children: Not on file   Years of education: Not on file   Highest education level: Not on file  Occupational History   Not on file  Tobacco Use   Smoking status: Former    Packs/day: 2.00    Years: 30.00    Total pack years: 60.00    Types: Cigarettes    Quit date: 2022    Years since quitting: 1.5   Smokeless tobacco: Never  Vaping Use   Vaping Use: Never used  Substance and Sexual Activity   Alcohol use: Yes    Comment: occ   Drug use: Never   Sexual activity: Yes  Other Topics Concern   Not on file  Social History Narrative   Not on file   Social Determinants of Health   Financial Resource Strain: Not on file  Food Insecurity: Not on file  Transportation Needs: Not on file  Physical Activity: Not on file  Stress: Not on file  Social Connections: Not on file  Intimate Partner  Violence: Not on file    Review of Systems:  All other review of systems negative except as mentioned in the HPI.  Physical Exam: Vital signs BP 122/72   Pulse (!) 53   Temp (!) 97.3 F (36.3 C) (Temporal)   Ht '5\' 11"'$  (1.803 m)   Wt 219 lb (99.3 kg)   SpO2 97%   BMI 30.54 kg/m   General:   Alert,  Well-developed, well-nourished, pleasant and cooperative in NAD Airway:  Mallampati 2 Lungs:  Clear throughout to auscultation.   Heart:  Regular rate and rhythm; no murmurs, clicks, rubs,  or gallops. Abdomen:  Soft, nontender and nondistended. Normal bowel sounds.   Neuro/Psych:  Normal mood and affect. A and O x 3   Tushar Enns E. Candis Schatz, MD [facility]

## 2021-11-14 NOTE — Op Note (Signed)
[facility] Patient Name: Scott Shannon Procedure Date: 11/14/2021 9:55 AM MRN: [id number] Endoscopist: Nicki Reaper E. Candis Schatz , MD Age: 56 Referring MD:  Date of Birth: 05-29-1965 Gender: Male Account #: [id number] Procedure:                Colonoscopy Indications:              Screening for colorectal malignant neoplasm, This                            is the patient's first colonoscopy Medicines:                Monitored Anesthesia Care Procedure:                Pre-Anesthesia Assessment:                           - Prior to the procedure, a History and Physical                            was performed, and patient medications and                            allergies were reviewed. The patient's tolerance of                            previous anesthesia was also reviewed. The risks                            and benefits of the procedure and the sedation                            options and risks were discussed with the patient.                            All questions were answered, and informed consent                            was obtained. Prior Anticoagulants: The patient has                            taken no previous anticoagulant or antiplatelet                            agents. ASA Grade Assessment: II - A patient with                            mild systemic disease. After reviewing the risks                            and benefits, the patient was deemed in                            satisfactory condition to undergo the procedure.  After obtaining informed consent, the colonoscope                            was passed under direct vision. Throughout the                            procedure, the patient's blood pressure, pulse, and                            oxygen saturations were monitored continuously. The                            Olympus Scope [id number] was introduced through the                            anus and advanced to  the the terminal ileum, with                            identification of the appendiceal orifice and IC                            valve. The colonoscopy was performed without                            difficulty. The patient tolerated the procedure                            well. The quality of the bowel preparation was                            adequate. The terminal ileum, ileocecal valve,                            appendiceal orifice, and rectum were photographed.                            The bowel preparation used was SUPREP via split                            dose instruction. Scope In: 11:52:28 AM Scope Out: 50:09:38 PM Scope Withdrawal Time: 0 hours 21 minutes 29 seconds  Total Procedure Duration: 0 hours 24 minutes 16 seconds  Findings:                 The perianal and digital rectal examinations were                            normal. Pertinent negatives include normal                            sphincter tone and no palpable rectal lesions.                           A 3 mm polyp was found in the hepatic flexure. The  polyp was sessile. The polyp was removed with a                            cold snare. Resection and retrieval were complete.                            Estimated blood loss was minimal.                           Multiple (<10) flat polyps were found in the                            sigmoid colon. The polyps were 3 to 4 mm in size.                            Three of these polyps were removed with a cold                            snare. Resection and retrieval were complete.                            Estimated blood loss was minimal.                           A 4 mm polyp was found in the recto-sigmoid colon.                            The polyp was sessile. The polyp was removed with a                            cold snare. Resection and retrieval were complete.                            Estimated blood loss was minimal.                            A few small and large-mouthed diverticula were                            found in the sigmoid colon and descending colon.                            There was no evidence of diverticular bleeding.                           The exam was otherwise normal throughout the                            examined colon.                           The terminal ileum appeared normal.  The retroflexed view of the distal rectum and anal                            verge was normal and showed no anal or rectal                            abnormalities. Complications:            No immediate complications. Estimated Blood Loss:     Estimated blood loss was minimal. Impression:               - One 3 mm polyp at the hepatic flexure, removed                            with a cold snare. Resected and retrieved.                           - Multiple 3 to 4 mm polyps in the sigmoid colon,                            removed with a cold snare. Resected and retrieved.                           - One 4 mm polyp at the recto-sigmoid colon,                            removed with a cold snare. Resected and retrieved.                           - Mild diverticulosis in the sigmoid colon and in                            the descending colon. There was no evidence of                            diverticular bleeding.                           - The examined portion of the ileum was normal.                           - The distal rectum and anal verge are normal on                            retroflexion view. Recommendation:           - Patient has a contact number available for                            emergencies. The signs and symptoms of potential                            delayed complications were discussed with the  patient. Return to normal activities tomorrow.                            Written discharge instructions were provided to the                             patient.                           - Resume previous diet.                           - Continue present medications.                           - Await pathology results.                           - Repeat colonoscopy date to be determined after                            pending pathology results are reviewed for                            surveillance. Pacey Willadsen E. Candis Schatz, MD 11/14/2021 12:24:25 PM This report has been signed electronically.

## 2021-11-14 NOTE — Progress Notes (Signed)
Called to room to assist during endoscopic procedure.  Patient ID and intended procedure confirmed with present staff. Received instructions for my participation in the procedure from the performing physician.  

## 2021-11-15 ENCOUNTER — Telehealth: Payer: Self-pay | Admitting: *Deleted

## 2021-11-15 NOTE — Telephone Encounter (Signed)
Attempted to call patient for their post-procedure follow-up call. No answer. Left voicemail.   

## 2021-11-16 ENCOUNTER — Telehealth: Payer: Self-pay | Admitting: Family Medicine

## 2021-11-16 NOTE — Telephone Encounter (Signed)
Left message for patient to call back and schedule Medicare Annual Wellness Visit (AWV).   Please offer to do virtually or by telephone.  Left office number and my jabber #[phone].  AWVI eligible as of  04/17/2009  Please schedule at anytime with Nurse Health Advisor.   

## 2021-11-18 ENCOUNTER — Telehealth: Payer: Self-pay | Admitting: Family Medicine

## 2021-11-18 DIAGNOSIS — Z122 Encounter for screening for malignant neoplasm of respiratory organs: Secondary | ICD-10-CM
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
## 2021-11-18 NOTE — Telephone Encounter (Signed)
Advised patient of annotation below. He verbalized understanding. 

## 2021-11-22 NOTE — Progress Notes (Signed)
Scott Shannon, Two of the polyps which I removed during your recent procedure were proven to be completely benign but are considered "pre-cancerous" polyps that MAY have grown into cancer if they had not been removed.  Studies shows that at least 20% of women over age 57 and 30% of men over age 23 have pre-cancerous polyps.  Based on current nationally recognized surveillance guidelines, I recommend that you have a repeat colonoscopy in 7 years.   If you develop any new rectal bleeding, abdominal pain or significant bowel habit changes, please contact me before then.

## 2021-11-29 ENCOUNTER — Ambulatory Visit (HOSPITAL_COMMUNITY)
Admission: RE | Admit: 2021-11-29 | Discharge: 2021-11-29 | Disposition: A | Discharge status: Home / Self Care | Payer: Medicare Other | Source: Ambulatory Visit | Attending: Family Medicine | Admitting: Family Medicine

## 2021-11-29 DIAGNOSIS — I700 Atherosclerosis of aorta: Secondary | ICD-10-CM | POA: Diagnosis not present

## 2021-11-29 DIAGNOSIS — J439 Emphysema, unspecified: Secondary | ICD-10-CM | POA: Insufficient documentation

## 2021-11-29 DIAGNOSIS — Z122 Encounter for screening for malignant neoplasm of respiratory organs: Secondary | ICD-10-CM | POA: Insufficient documentation

## 2021-11-29 DIAGNOSIS — I2510 Atherosclerotic heart disease of native coronary artery without angina pectoris: Secondary | ICD-10-CM | POA: Diagnosis not present

## 2021-11-29 DIAGNOSIS — Z87891 Personal history of nicotine dependence: Secondary | ICD-10-CM | POA: Diagnosis not present

## 2021-12-07 ENCOUNTER — Ambulatory Visit (INDEPENDENT_AMBULATORY_CARE_PROVIDER_SITE_OTHER): Payer: Medicare Other

## 2021-12-07 DIAGNOSIS — Z0000 Encounter for general adult medical examination without abnormal findings: Secondary | ICD-10-CM

## 2021-12-07 NOTE — Progress Notes (Signed)
Subjective:   Robertlee Rogacki. is a 56 y.o. male who presents for an Initial Medicare Annual Wellness Visit.   I connected with Merry Proud  today by telephone and verified that I am speaking with the correct person using two identifiers. Location patient: home Location provider: work Persons participating in the virtual visit: patient, provider.   I discussed the limitations, risks, security and privacy concerns of performing an evaluation and management service by telephone and the availability of in person appointments. I also discussed with the patient that there may be a patient responsible charge related to this service. The patient expressed understanding and verbally consented to this telephonic visit.    Interactive audio and video telecommunications were attempted between this provider and patient, however failed, due to patient having technical difficulties OR patient did not have access to video capability.  We continued and completed visit with audio only.    Review of Systems     Cardiac Risk Factors include: advanced age (>40mn, >>48women);male gender     Objective:    Today's Vitals   There is no height or weight on file to calculate BMI.     12/07/2021    3:09 PM  Advanced Directives  Does Patient Have a Medical Advance Directive? No  Would patient like information on creating a medical advance directive? No - Patient declined    Current Medications (verified) Outpatient Encounter Medications as of 12/07/2021  Medication Sig   Multiple Vitamin (MULTIVITAMIN ADULT PO) Take by mouth daily. TAKE ONE DAILY   rosuvastatin (CRESTOR) 10 MG tablet Take 10 mg by mouth daily.   Zinc Oxide 30 % OINT Apply 1 g topically daily.   Facility-Administered Encounter Medications as of 12/07/2021  Medication   0.9 %  sodium chloride infusion    Allergies (verified) Amoxicillin   History: Past Medical History:  Diagnosis Date   Arthritis    BILATERAL   Cataract     BILATERAL   Deaf    COCHLEAR IMPLANT REQUIRED   GERD (gastroesophageal reflux disease)    Hyperlipidemia    Past Surgical History:  Procedure Laterality Date   HAND SURGERY Bilateral    HEAD SUTURES     HIP REPLACEMENT Left    URINARY TRACT SURGERY     AS A CHILD   Family History  Problem Relation Age of Onset   Colon polyps Mother    Colon polyps Father    Cancer Father    Colon cancer Neg Hx    Crohn's disease Neg Hx    Esophageal cancer Neg Hx    Rectal cancer Neg Hx    Stomach cancer Neg Hx    Social History   Socioeconomic History   Marital status: Married    Spouse name: Not on file   Number of children: Not on file   Years of education: Not on file   Highest education level: Not on file  Occupational History   Not on file  Tobacco Use   Smoking status: Former    Packs/day: 2.00    Years: 30.00    Total pack years: 60.00    Types: Cigarettes    Quit date: 2022    Years since quitting: 1.6   Smokeless tobacco: Never  Vaping Use   Vaping Use: Never used  Substance and Sexual Activity   Alcohol use: Yes    Comment: occ   Drug use: Never   Sexual activity: Yes  Other Topics Concern  Not on file  Social History Narrative   Not on file   Social Determinants of Health   Financial Resource Strain: Low Risk  (12/07/2021)   Overall Financial Resource Strain (CARDIA)    Difficulty of Paying Living Expenses: Not hard at all  Food Insecurity: No Food Insecurity (12/07/2021)   Hunger Vital Sign    Worried About Running Out of Food in the Last Year: Never true    Ran Out of Food in the Last Year: Never true  Transportation Needs: No Transportation Needs (12/07/2021)   PRAPARE - Hydrologist (Medical): No    Lack of Transportation (Non-Medical): No  Physical Activity: Sufficiently Active (12/07/2021)   Exercise Vital Sign    Days of Exercise per Week: 3 days    Minutes of Exercise per Session: 60 min  Stress: No Stress Concern  Present (12/07/2021)   Max    Feeling of Stress : Not at all  Social Connections: Moderately Isolated (12/07/2021)   Social Connection and Isolation Panel [NHANES]    Frequency of Communication with Friends and Family: Three times a week    Frequency of Social Gatherings with Friends and Family: Three times a week    Attends Religious Services: Never    Active Member of Clubs or Organizations: No    Attends Archivist Meetings: Never    Marital Status: Married    Tobacco Counseling Counseling given: Not Answered   Clinical Intake:  Pre-visit preparation completed: Yes  Pain : No/denies pain     Nutritional Risks: None Diabetes: No  How often do you need to have someone help you when you read instructions, pamphlets, or other written materials from your doctor or pharmacy?: 1 - Never What is the last grade level you completed in school?: [place]   Interpreter Needed?: No  Information entered by :: L.Wilson,LPN   Activities of Daily Living    12/07/2021    3:13 PM 12/07/2021    1:50 PM  In your present state of health, do you have any difficulty performing the following activities:  Hearing? 0 1  Vision? 0 0  Difficulty concentrating or making decisions? 0 0  Walking or climbing stairs? 0 0  Dressing or bathing? 0 0  Doing errands, shopping? 0 0  Preparing Food and eating ? N N  Using the Toilet? N N  In the past six months, have you accidently leaked urine? N N  Do you have problems with loss of bowel control? N N  Managing your Medications? N N  Managing your Finances? N N  Housekeeping or managing your Housekeeping? N N    Patient Care Team: Bonnita Hollow, MD as PCP - General (Family Medicine)  Indicate any recent Medical Services you may have received from other than [facility] providers in the past year (date may be approximate).     Assessment:   This is  a routine wellness examination for Rhonda.  Hearing/Vision screen Vision Screening - Comments:: Annual eye exams wear glasses   Dietary issues and exercise activities discussed: Current Exercise Habits: Home exercise routine, Type of exercise: walking;strength training/weights, Time (Minutes): 60, Frequency (Times/Week): 3, Weekly Exercise (Minutes/Week): 180, Intensity: Mild, Exercise limited by: None identified   Goals Addressed   None    Depression Screen    12/07/2021    3:10 PM 12/07/2021    3:07 PM 09/14/2021   11:12 AM  08/30/2021    9:22 AM  PHQ 2/9 Scores  PHQ - 2 Score 0 0 0 0    Fall Risk    12/07/2021    3:10 PM 12/07/2021    1:50 PM 12/05/2021   10:02 AM 09/14/2021   11:12 AM 08/30/2021    9:21 AM  Fall Risk   Falls in the past year? 0 0 0 0 0  Number falls in past yr: 0 0 0 0 0  Injury with Fall? 0 0 0 0 0  Follow up Falls evaluation completed;Education provided        FALL RISK PREVENTION PERTAINING TO THE HOME:  Any stairs in or around the home? No  If so, are there any without handrails? No  Home free of loose throw rugs in walkways, pet beds, electrical cords, etc? Yes  Adequate lighting in your home to reduce risk of falls? Yes   ASSISTIVE DEVICES UTILIZED TO PREVENT FALLS:  Life alert? No  Use of a cane, walker or w/c? No  Grab bars in the bathroom? No  Shower chair or bench in shower? No  Elevated toilet seat or a handicapped toilet? No    Cognitive Function:    Normal cognitive status assessed by telephone conversation  by this Nurse Health Advisor. No abnormalities found.      12/07/2021    3:14 PM  6CIT Screen  What Year? 0 points  What month? 0 points  What time? 0 points  Count back from 20 0 points  Months in reverse 0 points  Repeat phrase 0 points  Total Score 0 points    Immunizations Immunization History  Administered Date(s) Administered   [employer] Covid-19 Vaccine Bivalent Booster 62yr & up 11/19/2019, 12/18/2019    TDAP  status: Due, Education has been provided regarding the importance of this vaccine. Advised may receive this vaccine at local pharmacy or Health Dept. Aware to provide a copy of the vaccination record if obtained from local pharmacy or Health Dept. Verbalized acceptance and understanding.  Flu Vaccine status: Due, Education has been provided regarding the importance of this vaccine. Advised may receive this vaccine at local pharmacy or Health Dept. Aware to provide a copy of the vaccination record if obtained from local pharmacy or Health Dept. Verbalized acceptance and understanding.  Pneumococcal vaccine status: Due, Education has been provided regarding the importance of this vaccine. Advised may receive this vaccine at local pharmacy or Health Dept. Aware to provide a copy of the vaccination record if obtained from local pharmacy or Health Dept. Verbalized acceptance and understanding.  Covid-19 vaccine status: Completed vaccines  Qualifies for Shingles Vaccine? Yes   Zostavax completed No   Shingrix Completed?: No.    Education has been provided regarding the importance of this vaccine. Patient has been advised to call insurance company to determine out of pocket expense if they have not yet received this vaccine. Advised may also receive vaccine at local pharmacy or Health Dept. Verbalized acceptance and understanding.  Screening Tests Health Maintenance  Topic Date Due   HIV Screening  Never done   Hepatitis C Screening  Never done   COVID-19 Vaccine (3 - Pfizer risk series) 01/15/2020   INFLUENZA VACCINE  11/15/2021   Zoster Vaccines- Shingrix (1 of 2) 12/28/2021 (Originally [birth year]/31/1984)   TETANUS/TDAP  09/28/2022 (Originally 01/31/1984)   COLONOSCOPY (Pts [phone] coverage will need to be confirmed)  11/14/2028   [facility]  Maintenance Due  Topic Date Due   HIV Screening  Never done   Hepatitis C Screening  Never done    COVID-19 Vaccine (3 - Pfizer risk series) 01/15/2020   INFLUENZA VACCINE  11/15/2021    Colorectal cancer screening: Type of screening: Colonoscopy. Completed 10/17/2021. Repeat every 7 years  Lung Cancer Screening: (Low Dose CT Chest recommended if Age 49-80 years, 30 pack-year currently smoking OR have quit w/in 15years.) does not qualify.   Lung Cancer Screening Referral: n/a  Additional Screening:  Hepatitis C Screening: does not qualify;   Vision Screening: Recommended annual ophthalmology exams for early detection of glaucoma and other disorders of the eye. Is the patient up to date with their annual eye exam?  Yes  Who is the provider or what is the name of the office in which the patient attends annual eye exams? [facility]  If pt is not established with a provider, would they like to be referred to a provider to establish care? No .   Dental Screening: Recommended annual dental exams for proper oral hygiene  Community Resource Referral / Chronic Care Management: CRR required this visit?  No   CCM required this visit?  No      Plan:     I have personally reviewed and noted the following in the patient's chart:   Medical and social history Use of alcohol, tobacco or illicit drugs  Current medications and supplements including opioid prescriptions. Patient is not currently taking opioid prescriptions. Functional ability and status Nutritional status Physical activity Advanced directives List of other physicians Hospitalizations, surgeries, and ER visits in previous 12 months Vitals Screenings to include cognitive, depression, and falls Referrals and appointments  In addition, I have reviewed and discussed with patient certain preventive protocols, quality metrics, and best practice recommendations. A written personalized care plan for preventive services as well as general preventive health recommendations were provided to patient.     Daphane Shepherd,  LPN   [id number]   Nurse Notes: none

## 2021-12-07 NOTE — Patient Instructions (Signed)
Scott Shannon , Thank you for taking time to come for your Medicare Wellness Visit. I appreciate your ongoing commitment to your health goals. Please review the following plan we discussed and let me know if I can assist you in the future.   Screening recommendations/referrals: Colonoscopy: 10/17/2021  due 2030 Recommended yearly ophthalmology/optometry visit for glaucoma screening and checkup Recommended yearly dental visit for hygiene and checkup  Vaccinations: Influenza vaccine: completed  Pneumococcal vaccine: due  Tdap vaccine: due  Shingles vaccine: will consider     Advanced directives: none   Conditions/risks identified: none   Next appointment: none   Preventive Care 40-64 Years, Male Preventive care refers to lifestyle choices and visits with your health care provider that can promote health and wellness. What does preventive care include? A yearly physical exam. This is also called an annual well check. Dental exams once or twice a year. Routine eye exams. Ask your health care provider how often you should have your eyes checked. Personal lifestyle choices, including: Daily care of your teeth and gums. Regular physical activity. Eating a healthy diet. Avoiding tobacco and drug use. Limiting alcohol use. Practicing safe sex. Taking low-dose aspirin every day starting at age 42. What happens during an annual well check? The services and screenings done by your health care provider during your annual well check will depend on your age, overall health, lifestyle risk factors, and family history of disease. Counseling  Your health care provider may ask you questions about your: Alcohol use. Tobacco use. Drug use. Emotional well-being. Home and relationship well-being. Sexual activity. Eating habits. Work and work Statistician. Screening  You may have the following tests or measurements: Height, weight, and BMI. Blood pressure. Lipid and cholesterol levels. These may  be checked every 5 years, or more frequently if you are over 22 years old. Skin check. Lung cancer screening. You may have this screening every year starting at age 63 if you have a 30-pack-year history of smoking and currently smoke or have quit within the past 15 years. Fecal occult blood test (FOBT) of the stool. You may have this test every year starting at age 18. Flexible sigmoidoscopy or colonoscopy. You may have a sigmoidoscopy every 5 years or a colonoscopy every 10 years starting at age 66. Prostate cancer screening. Recommendations will vary depending on your family history and other risks. Hepatitis C blood test. Hepatitis B blood test. Sexually transmitted disease (STD) testing. Diabetes screening. This is done by checking your blood sugar (glucose) after you have not eaten for a while (fasting). You may have this done every 1-3 years. Discuss your test results, treatment options, and if necessary, the need for more tests with your health care provider. Vaccines  Your health care provider may recommend certain vaccines, such as: Influenza vaccine. This is recommended every year. Tetanus, diphtheria, and acellular pertussis (Tdap, Td) vaccine. You may need a Td booster every 10 years. Zoster vaccine. You may need this after age 67. Pneumococcal 13-valent conjugate (PCV13) vaccine. You may need this if you have certain conditions and have not been vaccinated. Pneumococcal polysaccharide (PPSV23) vaccine. You may need one or two doses if you smoke cigarettes or if you have certain conditions. Talk to your health care provider about which screenings and vaccines you need and how often you need them. This information is not intended to replace advice given to you by your health care provider. Make sure you discuss any questions you have with your health care provider. Document Released:  04/30/2015 Document Revised: 12/22/2015 Document Reviewed: 02/02/2015 Elsevier Interactive Patient  Education  2017 [place] Prevention in the Home Falls can cause injuries. They can happen to people of all ages. There are many things you can do to make your home safe and to help prevent falls. What can I do on the outside of my home? Regularly fix the edges of walkways and driveways and fix any cracks. Remove anything that might make you trip as you walk through a door, such as a raised step or threshold. Trim any bushes or trees on the path to your home. Use bright outdoor lighting. Clear any walking paths of anything that might make someone trip, such as rocks or tools. Regularly check to see if handrails are loose or broken. Make sure that both sides of any steps have handrails. Any raised decks and porches should have guardrails on the edges. Have any leaves, snow, or ice cleared regularly. Use sand or salt on walking paths during winter. Clean up any spills in your garage right away. This includes oil or grease spills. What can I do in the bathroom? Use night lights. Install grab bars by the toilet and in the tub and shower. Do not use towel bars as grab bars. Use non-skid mats or decals in the tub or shower. If you need to sit down in the shower, use a plastic, non-slip stool. Keep the floor dry. Clean up any water that spills on the floor as soon as it happens. Remove soap buildup in the tub or shower regularly. Attach bath mats securely with double-sided non-slip rug tape. Do not have throw rugs and other things on the floor that can make you trip. What can I do in the bedroom? Use night lights. Make sure that you have a light by your bed that is easy to reach. Do not use any sheets or blankets that are too big for your bed. They should not hang down onto the floor. Have a firm chair that has side arms. You can use this for support while you get dressed. Do not have throw rugs and other things on the floor that can make you trip. What can I do in the  kitchen? Clean up any spills right away. Avoid walking on wet floors. Keep items that you use a lot in easy-to-reach places. If you need to reach something above you, use a strong step stool that has a grab bar. Keep electrical cords out of the way. Do not use floor polish or wax that makes floors slippery. If you must use wax, use non-skid floor wax. Do not have throw rugs and other things on the floor that can make you trip. What can I do with my stairs? Do not leave any items on the stairs. Make sure that there are handrails on both sides of the stairs and use them. Fix handrails that are broken or loose. Make sure that handrails are as long as the stairways. Check any carpeting to make sure that it is firmly attached to the stairs. Fix any carpet that is loose or worn. Avoid having throw rugs at the top or bottom of the stairs. If you do have throw rugs, attach them to the floor with carpet tape. Make sure that you have a light switch at the top of the stairs and the bottom of the stairs. If you do not have them, ask someone to add them for you. What else can I do to help prevent  falls? Wear shoes that: Do not have high heels. Have rubber bottoms. Are comfortable and fit you well. Are closed at the toe. Do not wear sandals. If you use a stepladder: Make sure that it is fully opened. Do not climb a closed stepladder. Make sure that both sides of the stepladder are locked into place. Ask someone to hold it for you, if possible. Clearly mark and make sure that you can see: Any grab bars or handrails. First and last steps. Where the edge of each step is. Use tools that help you move around (mobility aids) if they are needed. These include: Canes. Walkers. Scooters. Crutches. Turn on the lights when you go into a dark area. Replace any light bulbs as soon as they burn out. Set up your furniture so you have a clear path. Avoid moving your furniture around. If any of your floors are  uneven, fix them. If there are any pets around you, be aware of where they are. Review your medicines with your doctor. Some medicines can make you feel dizzy. This can increase your chance of falling. Ask your doctor what other things that you can do to help prevent falls. This information is not intended to replace advice given to you by your health care provider. Make sure you discuss any questions you have with your health care provider. Document Released: 01/28/2009 Document Revised: 09/09/2015 Document Reviewed: 05/08/2014 Elsevier Interactive Patient Education  2017 [employer].

## 2021-12-08 ENCOUNTER — Other Ambulatory Visit: Payer: Self-pay

## 2021-12-08 DIAGNOSIS — E782 Mixed hyperlipidemia: Secondary | ICD-10-CM

## 2021-12-08 MED ORDER — ROSUVASTATIN CALCIUM 10 MG PO TABS: 10.0000 mg | ORAL_TABLET | Freq: Every day | ORAL | 5 refills | Status: DC

## 2021-12-08 NOTE — Telephone Encounter (Signed)
Chart supports rx. Last OV: 09/27/2021

## 2021-12-13 ENCOUNTER — Telehealth: Payer: Self-pay

## 2021-12-13 NOTE — Telephone Encounter (Signed)
Left message for patient to call back and schedule Medicare Annual Wellness Visit (AWV).    Please offer to do virtually or by telephone.  Left office number.  Due for AWVI   Please schedule at anytime with Nurse Health Advisor

## 2022-01-03 ENCOUNTER — Ambulatory Visit: Payer: Medicare Other | Admitting: Family Medicine

## 2022-01-03 ENCOUNTER — Telehealth: Payer: Self-pay | Admitting: Family Medicine

## 2022-01-03 NOTE — Telephone Encounter (Signed)
Pt was after the 10 minute grace period for his 9/19 OV with Dr. Grandville Silos. He has been rescheduled. This is his first no show, letter has been sent

## 2022-01-04 NOTE — Telephone Encounter (Signed)
late arrival/1st no show, fee waived, pt has rescheduled

## 2022-01-16 ENCOUNTER — Ambulatory Visit (INDEPENDENT_AMBULATORY_CARE_PROVIDER_SITE_OTHER): Payer: Medicare Other | Admitting: Family Medicine

## 2022-01-16 ENCOUNTER — Encounter: Payer: Self-pay | Admitting: Family Medicine

## 2022-01-16 VITALS — BP 126/82 | HR 49 | Temp 97.8°F | Wt 217.2 lb

## 2022-01-16 DIAGNOSIS — E118 Type 2 diabetes mellitus with unspecified complications: Secondary | ICD-10-CM | POA: Diagnosis not present

## 2022-01-16 DIAGNOSIS — E6609 Other obesity due to excess calories: Secondary | ICD-10-CM

## 2022-01-16 DIAGNOSIS — G8929 Other chronic pain: Secondary | ICD-10-CM

## 2022-01-16 DIAGNOSIS — E785 Hyperlipidemia, unspecified: Secondary | ICD-10-CM

## 2022-01-16 DIAGNOSIS — Z96642 Presence of left artificial hip joint: Secondary | ICD-10-CM

## 2022-01-16 DIAGNOSIS — E1169 Type 2 diabetes mellitus with other specified complication: Secondary | ICD-10-CM

## 2022-01-16 DIAGNOSIS — Z1159 Encounter for screening for other viral diseases: Secondary | ICD-10-CM

## 2022-01-16 DIAGNOSIS — Z6830 Body mass index (BMI) 30.0-30.9, adult: Secondary | ICD-10-CM

## 2022-01-16 DIAGNOSIS — Z114 Encounter for screening for human immunodeficiency virus [HIV]: Secondary | ICD-10-CM

## 2022-01-16 DIAGNOSIS — M25561 Pain in right knee: Secondary | ICD-10-CM

## 2022-01-16 DIAGNOSIS — M25562 Pain in left knee: Secondary | ICD-10-CM

## 2022-01-16 LAB — URINALYSIS, ROUTINE W REFLEX MICROSCOPIC
Bilirubin Urine: NEGATIVE
Hgb urine dipstick: NEGATIVE
Ketones, ur: NEGATIVE
Leukocytes,Ua: NEGATIVE
Nitrite: NEGATIVE
Specific Gravity, Urine: 1.01 (ref 1.000–1.030)
Total Protein, Urine: NEGATIVE
Urine Glucose: NEGATIVE
Urobilinogen, UA: 0.2 (ref 0.0–1.0)
pH: 6 (ref 5.0–8.0)

## 2022-01-16 LAB — MICROALBUMIN / CREATININE URINE RATIO
Creatinine,U: 83 mg/dL
Microalb Creat Ratio: 0.8 mg/g (ref 0.0–30.0)
Microalb, Ur: <0.7 mg/dL (ref 0.0–1.9)

## 2022-01-16 MED ORDER — MELOXICAM 15 MG PO TABS: 15.0000 mg | ORAL_TABLET | Freq: Every day | ORAL | 0 refills | Status: DC

## 2022-01-16 MED ORDER — OLMESARTAN MEDOXOMIL 5 MG PO TABS: 5.0000 mg | ORAL_TABLET | Freq: Every day | ORAL | 3 refills | Status: DC

## 2022-01-16 NOTE — Patient Instructions (Addendum)
For diabetes, we are rechecking blood work. We are starting olmesartan for kidney protection.   For eyes, we are referring you to an eye doctor.   For knee pain, we are referring you to orthopedics. We are starting meloxicam for pain. Stop taking naproxen.   Your BMI is elevated. Continue to work on low fat/low carb diet and exercise.

## 2022-01-20 ENCOUNTER — Telehealth: Payer: Self-pay

## 2022-01-20 ENCOUNTER — Ambulatory Visit (INDEPENDENT_AMBULATORY_CARE_PROVIDER_SITE_OTHER): Payer: Medicare Other | Admitting: Physician Assistant

## 2022-01-20 ENCOUNTER — Encounter: Payer: Self-pay | Admitting: Physician Assistant

## 2022-01-20 ENCOUNTER — Other Ambulatory Visit (INDEPENDENT_AMBULATORY_CARE_PROVIDER_SITE_OTHER): Payer: Medicare Other

## 2022-01-20 DIAGNOSIS — E785 Hyperlipidemia, unspecified: Secondary | ICD-10-CM

## 2022-01-20 DIAGNOSIS — M2550 Pain in unspecified joint: Secondary | ICD-10-CM

## 2022-01-20 DIAGNOSIS — Z6830 Body mass index (BMI) 30.0-30.9, adult: Secondary | ICD-10-CM | POA: Diagnosis not present

## 2022-01-20 DIAGNOSIS — E6609 Other obesity due to excess calories: Secondary | ICD-10-CM | POA: Diagnosis not present

## 2022-01-20 DIAGNOSIS — Z1159 Encounter for screening for other viral diseases: Secondary | ICD-10-CM

## 2022-01-20 DIAGNOSIS — M1389 Other specified arthritis, multiple sites: Secondary | ICD-10-CM | POA: Diagnosis not present

## 2022-01-20 DIAGNOSIS — E1169 Type 2 diabetes mellitus with other specified complication: Secondary | ICD-10-CM | POA: Diagnosis not present

## 2022-01-20 DIAGNOSIS — Z114 Encounter for screening for human immunodeficiency virus [HIV]: Secondary | ICD-10-CM

## 2022-01-20 DIAGNOSIS — E118 Type 2 diabetes mellitus with unspecified complications: Secondary | ICD-10-CM | POA: Diagnosis not present

## 2022-01-20 HISTORY — DX: Pain in unspecified joint: M25.50

## 2022-01-20 LAB — LIPID PANEL
Cholesterol: 153 mg/dL (ref 0–200)
HDL: 47.1 mg/dL (ref >39.00)
LDL Cholesterol: 83 mg/dL (ref 0–99)
NonHDL: 105.99
Total CHOL/HDL Ratio: 3
Triglycerides: 117 mg/dL (ref 0.0–149.0)
VLDL: 23.4 mg/dL (ref 0.0–40.0)

## 2022-01-20 LAB — HEMOGLOBIN A1C: Hgb A1c MFr Bld: 6.8 % — ABNORMAL HIGH (ref 4.6–6.5)

## 2022-01-20 NOTE — Telephone Encounter (Signed)
We are seeing a mutual patient and he said that you guys drew blood on him today. Anyway you guys can add an Arthritis/Rheumatoid panel on that blood so we don't have to stick him again?
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
## 2022-01-22 DIAGNOSIS — E1165 Type 2 diabetes mellitus with hyperglycemia: Secondary | ICD-10-CM | POA: Insufficient documentation

## 2022-01-22 DIAGNOSIS — E118 Type 2 diabetes mellitus with unspecified complications: Secondary | ICD-10-CM | POA: Insufficient documentation

## 2022-01-22 DIAGNOSIS — E119 Type 2 diabetes mellitus without complications: Secondary | ICD-10-CM | POA: Insufficient documentation

## 2022-01-22 NOTE — Assessment & Plan Note (Signed)
Encouraged low-carb diet and exercise Associate with hyperlipidemia Restratification labs
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
## 2022-01-25 LAB — VITAMIN D 1,25 DIHYDROXY
Vitamin D 1, 25 (OH)2 Total: 32 pg/mL (ref 18–72)
Vitamin D2 1, 25 (OH)2: <8 pg/mL
Vitamin D3 1, 25 (OH)2: 32 pg/mL

## 2022-01-25 LAB — HIV ANTIBODY (ROUTINE TESTING W REFLEX): HIV 1&2 Ab, 4th Generation: NONREACTIVE

## 2022-01-25 LAB — HEPATITIS C ANTIBODY: Hepatitis C Ab: NONREACTIVE

## 2022-04-08 ENCOUNTER — Other Ambulatory Visit: Payer: Self-pay | Admitting: Family Medicine

## 2022-04-08 DIAGNOSIS — E782 Mixed hyperlipidemia: Secondary | ICD-10-CM

## 2022-04-11 NOTE — Telephone Encounter (Signed)
Chart supports rx. Last OV: [id number] Next OV: [id number]

## 2022-04-13 LAB — HM DIABETES EYE EXAM

## 2022-06-02 ENCOUNTER — Encounter: Payer: Self-pay | Admitting: Family Medicine

## 2022-06-02 ENCOUNTER — Ambulatory Visit (INDEPENDENT_AMBULATORY_CARE_PROVIDER_SITE_OTHER): Payer: 59 | Admitting: Family Medicine

## 2022-06-02 VITALS — BP 124/78 | HR 68 | Temp 97.8°F | Ht 71.0 in | Wt 220.6 lb

## 2022-06-02 DIAGNOSIS — R61 Generalized hyperhidrosis: Secondary | ICD-10-CM

## 2022-06-02 DIAGNOSIS — E118 Type 2 diabetes mellitus with unspecified complications: Secondary | ICD-10-CM

## 2022-06-02 DIAGNOSIS — E785 Hyperlipidemia, unspecified: Secondary | ICD-10-CM | POA: Diagnosis not present

## 2022-06-02 DIAGNOSIS — L304 Erythema intertrigo: Secondary | ICD-10-CM | POA: Diagnosis not present

## 2022-06-02 DIAGNOSIS — E1169 Type 2 diabetes mellitus with other specified complication: Secondary | ICD-10-CM

## 2022-06-02 DIAGNOSIS — L989 Disorder of the skin and subcutaneous tissue, unspecified: Secondary | ICD-10-CM

## 2022-06-02 DIAGNOSIS — I2510 Atherosclerotic heart disease of native coronary artery without angina pectoris: Secondary | ICD-10-CM

## 2022-06-02 DIAGNOSIS — M2550 Pain in unspecified joint: Secondary | ICD-10-CM

## 2022-06-02 LAB — COMPREHENSIVE METABOLIC PANEL
ALT: 29 U/L (ref 0–53)
AST: 20 U/L (ref 0–37)
Albumin: 4.4 g/dL (ref 3.5–5.2)
Alkaline Phosphatase: 73 U/L (ref 39–117)
BUN: 22 mg/dL (ref 6–23)
CO2: 27 mEq/L (ref 19–32)
Calcium: 9.5 mg/dL (ref 8.4–10.5)
Chloride: 103 mEq/L (ref 96–112)
Creatinine, Ser: 1.21 mg/dL (ref 0.40–1.50)
GFR: 66.55 mL/min (ref >60.00)
Glucose, Bld: 150 mg/dL — ABNORMAL HIGH (ref 70–99)
Potassium: 4 mEq/L (ref 3.5–5.1)
Sodium: 138 mEq/L (ref 135–145)
Total Bilirubin: 0.4 mg/dL (ref 0.2–1.2)
Total Protein: 6.6 g/dL (ref 6.0–8.3)

## 2022-06-02 LAB — LIPID PANEL
Cholesterol: 157 mg/dL (ref 0–200)
HDL: 54.1 mg/dL (ref >39.00)
LDL Cholesterol: 65 mg/dL (ref 0–99)
NonHDL: 102.71
Total CHOL/HDL Ratio: 3
Triglycerides: 187 mg/dL — ABNORMAL HIGH (ref 0.0–149.0)
VLDL: 37.4 mg/dL (ref 0.0–40.0)

## 2022-06-02 LAB — CBC WITH DIFFERENTIAL/PLATELET
Basophils Absolute: 0.1 10*3/uL (ref 0.0–0.1)
Basophils Relative: 1 % (ref 0.0–3.0)
Eosinophils Absolute: 0.2 10*3/uL (ref 0.0–0.7)
Eosinophils Relative: 2.2 % (ref 0.0–5.0)
HCT: 41.4 % (ref 39.0–52.0)
Hemoglobin: 14.1 g/dL (ref 13.0–17.0)
Lymphocytes Relative: 26.8 % (ref 12.0–46.0)
Lymphs Abs: 2 10*3/uL (ref 0.7–4.0)
MCHC: 34 g/dL (ref 30.0–36.0)
MCV: 92.3 fl (ref 78.0–100.0)
Monocytes Absolute: 0.5 10*3/uL (ref 0.1–1.0)
Monocytes Relative: 7.1 % (ref 3.0–12.0)
Neutro Abs: 4.7 10*3/uL (ref 1.4–7.7)
Neutrophils Relative %: 62.9 % (ref 43.0–77.0)
Platelets: 252 10*3/uL (ref 150.0–400.0)
RBC: 4.49 Mil/uL (ref 4.22–5.81)
RDW: 14.2 % (ref 11.5–15.5)
WBC: 7.4 10*3/uL (ref 4.0–10.5)

## 2022-06-02 LAB — C-REACTIVE PROTEIN: CRP: <1 mg/dL (ref 0.5–20.0)

## 2022-06-02 LAB — TSH: TSH: 1.68 u[IU]/mL (ref 0.35–5.50)

## 2022-06-02 LAB — SEDIMENTATION RATE: Sed Rate: 11 mm/hr (ref 0–20)

## 2022-06-02 LAB — HEMOGLOBIN A1C: Hgb A1c MFr Bld: 7.1 % — ABNORMAL HIGH (ref 4.6–6.5)

## 2022-06-02 MED ORDER — ASPIRIN 81 MG PO TBEC: 81.0000 mg | DELAYED_RELEASE_TABLET | Freq: Every day | ORAL | 12 refills | Status: DC

## 2022-06-02 MED ORDER — ITRACONAZOLE 100 MG PO CAPS: 200.0000 mg | ORAL_CAPSULE | Freq: Every day | ORAL | 0 refills | Status: DC
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
## 2022-06-02 NOTE — Patient Instructions (Addendum)
For intertrigo, please take the itraconazole as prescribed. For night sweats, we are getting labs to rule out inflammatory conditions and to assess for possible underlying malignancy. For scalp lesion, we are referring to dermatology.

## 2022-06-04 ENCOUNTER — Other Ambulatory Visit: Payer: Self-pay | Admitting: Family Medicine

## 2022-06-04 DIAGNOSIS — E1165 Type 2 diabetes mellitus with hyperglycemia: Secondary | ICD-10-CM

## 2022-06-04 MED ORDER — BLOOD GLUCOSE TEST VI STRP: 1.0000 | ORAL_STRIP | Freq: Three times a day (TID) | 0 refills | Status: AC

## 2022-06-04 MED ORDER — BLOOD GLUCOSE MONITORING SUPPL DEVI: 1.0000 | Freq: Three times a day (TID) | 0 refills | Status: DC

## 2022-06-04 MED ORDER — LANCETS MISC. MISC: 1.0000 | Freq: Three times a day (TID) | 0 refills | Status: AC

## 2022-06-04 MED ORDER — METFORMIN HCL 500 MG PO TABS: 500.0000 mg | ORAL_TABLET | Freq: Two times a day (BID) | ORAL | 3 refills | Status: DC

## 2022-06-04 MED ORDER — LANCET DEVICE MISC: 1.0000 | Freq: Three times a day (TID) | 0 refills | Status: AC
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
## 2022-06-05 ENCOUNTER — Encounter: Payer: Self-pay | Admitting: Family Medicine

## 2022-06-05 DIAGNOSIS — L304 Erythema intertrigo: Secondary | ICD-10-CM | POA: Insufficient documentation

## 2022-06-05 DIAGNOSIS — R61 Generalized hyperhidrosis: Secondary | ICD-10-CM | POA: Insufficient documentation

## 2022-06-05 DIAGNOSIS — L989 Disorder of the skin and subcutaneous tissue, unspecified: Secondary | ICD-10-CM | POA: Insufficient documentation

## 2022-06-05 NOTE — Assessment & Plan Note (Signed)
Plan: Treat with itraconazole (SPORANOX) for potential fungal involvement. Consider alternative medications such as itraconazole if prior treatments fail. Coordinate care with dermatology for ongoing skin concerns.

## 2022-06-05 NOTE — Assessment & Plan Note (Signed)
Plan: Evaluate for rheumatoid arthritis with rheumatoid arthritis panel. Consider imaging if pain persists or worsens.

## 2022-06-05 NOTE — Assessment & Plan Note (Signed)
Differential diagnosis: Infection (consider fungal or bacterial), malignancy (review biopsy results from urologist for prostate cancer), hormonal imbalance (evaluate for thyroid dysfunction), inflammatory disease (considering joint pain and past medical history). Plan: Repeat basic labs including CBC, CMP, TSH, UA, ANA with reflex to rheumatoid arthritis panel, sedimentation rate, and C-reactive protein. Refer to dermatology for evaluation of skin lesions and possible psoriatic arthritis.

## 2022-06-06 ENCOUNTER — Telehealth: Payer: Self-pay

## 2022-06-06 DIAGNOSIS — L989 Disorder of the skin and subcutaneous tissue, unspecified: Secondary | ICD-10-CM

## 2022-06-06 DIAGNOSIS — B379 Candidiasis, unspecified: Secondary | ICD-10-CM

## 2022-06-06 DIAGNOSIS — E785 Hyperlipidemia, unspecified: Secondary | ICD-10-CM

## 2022-06-06 DIAGNOSIS — E118 Type 2 diabetes mellitus with unspecified complications: Secondary | ICD-10-CM

## 2022-06-06 DIAGNOSIS — R61 Generalized hyperhidrosis: Secondary | ICD-10-CM

## 2022-06-06 DIAGNOSIS — I2510 Atherosclerotic heart disease of native coronary artery without angina pectoris: Secondary | ICD-10-CM

## 2022-06-06 DIAGNOSIS — M2550 Pain in unspecified joint: Secondary | ICD-10-CM

## 2022-06-06 DIAGNOSIS — L304 Erythema intertrigo: Secondary | ICD-10-CM

## 2022-06-06 LAB — QUANTIFERON-TB GOLD PLUS
Mitogen-NIL: >10 IU/mL
NIL: 0.03 IU/mL
QuantiFERON-TB Gold Plus: NEGATIVE
TB1-NIL: 0 IU/mL
TB2-NIL: 0 IU/mL

## 2022-06-06 LAB — HLA-B27 ANTIGEN: HLA-B27 Antigen: NEGATIVE

## 2022-06-06 LAB — LACTATE DEHYDROGENASE: LDH: 153 U/L (ref 120–250)

## 2022-06-06 LAB — RHEUMATOID ARTHRITIS PROFILE
Cyclic Citrullin Peptide Ab: 6 units (ref 0–19)
Rheumatoid fact SerPl-aCnc: <10 IU/mL (ref <14.0)

## 2022-06-06 LAB — ANA W/REFLEX: Anti Nuclear Antibody (ANA): NEGATIVE

## 2022-06-06 NOTE — Telephone Encounter (Signed)
PA requested by [facility] Itraconazole 100 MG Capsule QT:14  Please assist.

## 2022-06-07 ENCOUNTER — Other Ambulatory Visit (HOSPITAL_COMMUNITY): Payer: Self-pay

## 2022-06-07 MED ORDER — ITRACONAZOLE 100 MG PO CAPS: 200.0000 mg | ORAL_CAPSULE | Freq: Every day | ORAL | 0 refills | Status: AC

## 2022-06-07 NOTE — Addendum Note (Signed)
Addended by: Bonnita Hollow on: 06/07/2022 09:15 PM   Modules accepted: Orders

## 2022-06-07 NOTE — Telephone Encounter (Signed)
Pharmacy Patient Advocate Encounter  Received notification from [employer] that the request for prior authorization for Itraconazole has been denied due to .    Please be advised we currently do not have a Pharmacist to review denials, therefore you will need to process appeals accordingly as needed. Thanks for your support at this time.   You may call [phone] or fax [phone], to appeal.

## 2022-06-07 NOTE — Telephone Encounter (Signed)
Pharmacy Patient Advocate Encounter   Received notification from [facility] that prior authorization for Itraconazole 100MG capsules is required/requested.  Per Test Claim: PA required   PA submitted on 06/07/22 to (ins) [employer] via CoverMyMeds Key [id number] Status is pending

## 2022-06-08 NOTE — Telephone Encounter (Signed)
Patient is aware and verbalized understanding.

## 2022-07-17 ENCOUNTER — Ambulatory Visit: Payer: Medicare Other | Admitting: Family Medicine

## 2022-07-26 ENCOUNTER — Other Ambulatory Visit: Payer: Self-pay

## 2022-07-26 ENCOUNTER — Encounter (HOSPITAL_COMMUNITY): Payer: Self-pay

## 2022-07-26 ENCOUNTER — Emergency Department (HOSPITAL_COMMUNITY)
Admission: EM | Admit: 2022-07-26 | Discharge: 2022-07-26 | Disposition: A | Discharge status: Home / Self Care | Payer: 59 | Attending: Emergency Medicine | Admitting: Emergency Medicine

## 2022-07-26 ENCOUNTER — Emergency Department (HOSPITAL_COMMUNITY): Payer: 59

## 2022-07-26 DIAGNOSIS — M25511 Pain in right shoulder: Secondary | ICD-10-CM | POA: Diagnosis present

## 2022-07-26 DIAGNOSIS — Z7984 Long term (current) use of oral hypoglycemic drugs: Secondary | ICD-10-CM | POA: Diagnosis not present

## 2022-07-26 DIAGNOSIS — Z7982 Long term (current) use of aspirin: Secondary | ICD-10-CM | POA: Diagnosis not present

## 2022-07-26 DIAGNOSIS — E119 Type 2 diabetes mellitus without complications: Secondary | ICD-10-CM | POA: Insufficient documentation

## 2022-07-26 MED ORDER — MELOXICAM 15 MG PO TABS: 15.0000 mg | ORAL_TABLET | Freq: Every day | ORAL | 0 refills | Status: DC

## 2022-07-26 MED ORDER — IBUPROFEN 800 MG PO TABS: 800.0000 mg | ORAL_TABLET | Freq: Once | ORAL | Status: DC

## 2022-07-26 NOTE — ED Provider Notes (Signed)
[place] [facility]GENC[facility] DE[facility]MENT AT [facility] Provider Note   CSN: [id number] Arrival date & time: 07/26/22  0930     History  Chief Complaint  Patient presents with   Rt Shoulder Pain    Scott Shannon. is a 57 y.o. male.  Patient presents to the emergency department complaining of right-sided shoulder pain which began yesterday after moving a heavy object.  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  He states that yesterday he was lifting a particularly heavy radiator when he began to have sharp right-sided shoulder pain.  The pain is worse with movements.  He denies any weakness in the right hand.  Denies numbness and tingling.  Past medical history significant for arthritis, type II DM  HPI     Home Medications Prior to Admission medications   Medication Sig Start Date End Date Taking? Authorizing Provider  Blood Glucose Monitoring Suppl DEVI 1 each by Does not apply route in the morning, at noon, and at bedtime. May substitute to any manufacturer covered by patient's insurance. 06/04/22   Garnette Gunner, MD  meloxicam (MOBIC) 15 MG tablet Take 1 tablet (15 mg total) by mouth daily for 15 days. 07/26/22 08/10/22 Yes Darrick Grinder, PA-C  metFORMIN (GLUCOPHAGE) 500 MG tablet Take 1 tablet (500 mg total) by mouth 2 (two) times daily with a meal. 06/04/22   Garnette Gunner, MD  aspirin EC 81 MG tablet Take 1 tablet (81 mg total) by mouth daily. Swallow whole. 06/02/22   Garnette Gunner, MD  Multiple Vitamin (MULTIVITAMIN ADULT PO) Take by mouth daily. TAKE ONE DAILY    [provider]  olmesartan (BENICAR) 5 MG tablet Take 1 tablet (5 mg total) by mouth daily. 01/16/22 01/11/23  Garnette Gunner, MD  rosuvastatin (CRESTOR) 10 MG tablet Take 1 tablet by mouth once daily 04/11/22   Garnette Gunner, MD  Zinc Oxide 30 % OINT Apply 1 g topically daily. Patient not taking: Reported on 06/02/2022 09/14/21   Garnette Gunner, MD       Allergies    Amoxicillin    Review of Systems   Review of Systems  Physical Exam Updated Vital Signs BP 126/86 (BP Location: Left Arm)   Pulse (!) 47   Temp (!) 97.5 F (36.4 C) (Oral)   Resp 16   SpO2 96%  Physical Exam Vitals and nursing note reviewed.  Constitutional:      General: He is not in acute distress.    Appearance: He is well-developed.  HENT:     Head: Normocephalic and atraumatic.  Eyes:     Conjunctiva/sclera: Conjunctivae normal.  Cardiovascular:     Rate and Rhythm: Normal rate and regular rhythm.     Heart sounds: No murmur heard. Pulmonary:     Effort: Pulmonary effort is normal. No respiratory distress.     Breath sounds: Normal breath sounds.  Abdominal:     Palpations: Abdomen is soft.     Tenderness: There is no abdominal tenderness.  Musculoskeletal:        General: Signs of injury present. No swelling or tenderness.     Cervical back: Neck supple.     Comments: Patient with pain with active abduction of the right shoulder, internal and external rotation.  Sensation intact distally, brisk cap refill noted distally  Skin:    General: Skin is warm and dry.     Capillary Refill: Capillary  refill takes less than 2 seconds.  Neurological:     Mental Status: He is alert.  Psychiatric:        Mood and Affect: Mood normal.     ED Results / Procedures / Treatments   Labs (all labs ordered are listed, but only abnormal results are displayed) Labs Reviewed - No data to display  EKG None  Radiology DG Shoulder Right  Result Date: 07/26/2022 CLINICAL DATA:  Sharp pain and superior aspect of right shoulder for 2 days. Felt pain after helping move a heavy air conditioner/radiator unit. EXAM: RIGHT SHOULDER - 2+ VIEW COMPARISON:  None Available. FINDINGS: There is diffuse decreased bone mineralization. Mild glenohumeral joint space narrowing and inferior degenerative spurring. Moderate anterior and posterior peripheral glenoid degenerative  osteophytosis. The humeral head is high-riding and contacts the undersurface of the acromion. There is moderate concave scalloping of the undersurface of the acromion. 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 The clavicular head appears to be 8 mm superiorly displaced with respect to the acromion on frontal view. No acute fracture is seen.  No dislocation. IMPRESSION: 1. Mild glenohumeral osteoarthritis. 2. The humeral head is high-riding and contacts the undersurface of the acromion. This is suspicious for a full-thickness superior rotator cuff tear. Electronically Signed   By: Neita Garnetonald  Viola M.D.   On: 07/26/2022 10:35    Procedures .Ortho Injury Treatment  Date/Time: 07/26/2022 3:29 PM  Performed by: Darrick GrinderMcCauley, Jalaiya Oyster B, PA-C Authorized by: Darrick GrinderMcCauley, Marquasha Brutus B, PA-C   Consent:    Consent obtained:  Verbal   Consent given by:  Patient   Risks discussed:  Vascular damage, restricted joint movement, nerve damage and stiffness   Alternatives discussed:  No treatmentInjury location: shoulder Location details: right shoulder Injury type: soft tissue Pre-procedure neurovascular assessment: neurovascularly intact Immobilization: sling Splint Applied by: Milon Dikesrtho Tech Post-procedure neurovascular assessment: post-procedure neurovascularly intact       Medications Ordered in ED Medications  ibuprofen (ADVIL) tablet 800 mg (has no administration in time range)    ED Course/ Medical Decision Making/ A&P                             Medical Decision Making Risk Prescription drug management.   Patient presents with a chief complaint of right shoulder pain.  Differential diagnosis includes but is not limited to fracture, dislocation, soft tissue injury, and others  Patient has comorbidities including arthritis  There is no indication at this time for lab work  I ordered and interpreted imaging including plain films of the right shoulder. 1. Mild glenohumeral  osteoarthritis.  2. The humeral head is high-riding and contacts the undersurface of  the acromion. This is suspicious for a full-thickness superior  rotator cuff tear.  I agree with the radiologist findings  I ordered the patient ibuprofen for his pain but the patient declined the medication.  I had the patient placed in a sling for support as noted in the procedure note  I considered nonmusculoskeletal causes of the patient's pain but the patient's pain was easily reproducible on exam.  Symptoms are concerning for possible rotator cuff injury.  X-ray also suggest findings suspicious for rotator cuff tear.  Patient will utilize sling.  Prescription sent for anti-inflammatory medication.  Patient will follow-up with orthopedics.  Patient currently sees Ortho care.  Discharge home at this time.       Final Clinical Impression(s) / ED Diagnoses Final  diagnoses:  Acute pain of right shoulder    Rx / DC Orders ED Discharge Orders          Ordered    meloxicam (MOBIC) 15 MG tablet  Daily        07/26/22 1342              Pamala Duffel 07/26/22 1532    Alvira Monday, MD 07/27/22 1235

## 2022-07-26 NOTE — Discharge Instructions (Signed)
You were evaluated today for right shoulder pain.  Your workup was reassuring for no signs of fracture or dislocation which exam is concerning for possible soft tissue injury, possible rotator cuff injury.  Please follow-up with orthopedics for further evaluation.  I have prescribed meloxicam to be taken once daily for inflammation.  Do not take other NSAID medications while taking this medication.  Please keep your right arm/shoulder in the sling for support.  You should avoid heavy lifting and only perform light duty at work until evaluated by orthopedics.

## 2022-07-26 NOTE — Progress Notes (Signed)
Orthopedic Tech Progress Note Patient Details:  Bralon Cima 04/07/1966 [id number]  Ortho Devices Type of Ortho Device: Shoulder immobilizer Ortho Device/Splint Location: RUE Ortho Device/Splint Interventions: Ordered, Application, Adjustment   Post Interventions Patient Tolerated: Well Instructions Provided: Care of device  Donald Pore 07/26/2022, 2:16 PM

## 2022-07-26 NOTE — ED Triage Notes (Signed)
Pt came in via POV d/t stabbing Rt shoulder pain that started yesterday, unaware what cause it, denies falls, A/Ox4, 9/10 pain. Denies numbness/tingling in that hand.

## 2022-07-26 NOTE — ED Provider Triage Note (Signed)
Emergency Medicine Provider Triage Evaluation Note  Scott Shannon. , a 57 y.o. male  was evaluated in triage.  Pt complains of shoulder pain. Atraumatic pain to R shoulder since yesterday, sharp stabbing.  Hx of arthritis.  Does report heavy lifting for work yesterday.  No neck pain, chest pain, numbness, weakness, fever, rash  Review of Systems  Positive: As above Negative: As above  Physical Exam  BP 122/73 (BP Location: Right Arm)   Pulse (!) 54   Temp 98.2 F (36.8 C) (Oral)   Resp 18   SpO2 96%  Gen:   Awake, no distress   Resp:  Normal effort  MSK:   Moves extremities without difficulty  Other:    Medical Decision Making  Medically screening exam initiated at 10:01 AM.  Appropriate orders placed.  Lyndal Pulley. was informed that the remainder of the evaluation will be completed by another provider, this initial triage assessment does not replace that evaluation, and the importance of remaining in the ED until their evaluation is complete.     Fayrene Helper, PA-C 07/26/22 1004

## 2022-07-26 NOTE — ED Notes (Signed)
Patient transported to X-ray 

## 2022-07-27 ENCOUNTER — Telehealth: Payer: Self-pay

## 2022-07-27 NOTE — Transitions of Care (Post Inpatient/ED Visit) (Signed)
   07/27/2022  Name: Scott Shannon. MRN: [id number] DOB: [birth year]  Today's TOC FU Call Status: Today's TOC FU Call Status:: Unsuccessul Call (1st Attempt) Unsuccessful Call (1st Attempt) Date: 07/27/22  Attempted to reach the patient regarding the most recent Inpatient/ED visit.  Follow Up Plan: Additional attempts will be made to contact patient.  Signature Arvil Persons, BSN, Charity fundraiser

## 2022-07-28 ENCOUNTER — Ambulatory Visit (INDEPENDENT_AMBULATORY_CARE_PROVIDER_SITE_OTHER): Payer: 59 | Admitting: Physician Assistant

## 2022-07-28 ENCOUNTER — Encounter: Payer: Self-pay | Admitting: Physician Assistant

## 2022-07-28 DIAGNOSIS — M75121 Complete rotator cuff tear or rupture of right shoulder, not specified as traumatic: Secondary | ICD-10-CM | POA: Diagnosis not present

## 2022-07-28 MED ORDER — HYDROCODONE-ACETAMINOPHEN 5-325 MG PO TABS: 1.0000 | ORAL_TABLET | ORAL | 0 refills | Status: DC | PRN

## 2022-07-28 NOTE — Progress Notes (Signed)
Office Visit Note   Patient: Scott Shannon.           Date of Birth: 17-Apr-1966           MRN: [id number] Visit Date: 07/28/2022              Requested by: Garnette Gunner, MD [address] [place],  [place] [address] PCP: Garnette Gunner, MD  Chief Complaint  Patient presents with   Right Shoulder - Pain      HPI: Patient is a pleasant 57 year old gentleman who have seen in the past for polyarthralgia.  He comes in today with a 2-day history of acute right shoulder pain.  He is right-handed.  He said he was lifting a radiator onto his right shoulder and had immediate onset of pain.  Since then he has had the inability to use his arm and raise it very high.  Denies any neck pain.  Denies any radicular findings.    Assessment & Plan: Visit Diagnoses:  1. Nontraumatic complete tear of right rotator cuff     Plan:He says he has had a long history of problems with his shoulders but was not specific.  He went to an emergency room.  X-rays there showed glenohumeral degenerative changes and also a high riding humeral head which could indicate significant rotator cuff damage.  I am concerned he has had an acute tear of his rotator cuff.  I have asked him to come out of the sling is much as he can and do pendulum exercises.  I will try and get a MRI of his right shoulder as soon as possible.  He will follow-up in 2 weeks with Franky Macho or Dr. August Saucer.  I called him in a few pain pills and he is really going to just try and take the meloxicam and just the pain medication at night.  Follow-Up Instructions: No follow-ups on file.   Ortho Exam  Patient is alert, oriented, no adenopathy, well-dressed, normal affect, normal respiratory effort. Examination of his right shoulder he is immobilized in a sling.  He has good extension and flexion of his elbow no ecchymosis and no swelling.  With range of motion he has quite a bit of crepitus.  He has limited forward elevation to about 90  degrees.  After this he becomes significantly painful.  Same is true with internal rotation behind his back.  His grip strength is intact his sensation is intact.  No pain with range of motion of his neck.  He has a negative drop arm test but this is difficult to assess as he cannot raise his arm above 90 degrees because of pain.  Imaging: No results found. No images are attached to the encounter.  Labs: Lab Results  Component Value Date   HGBA1C 7.1 (H) 06/02/2022   HGBA1C 6.8 (H) 01/20/2022   HGBA1C 6.8 (H) 09/14/2021   ESRSEDRATE 11 06/02/2022   CRP <1.0 06/02/2022     Lab Results  Component Value Date   ALBUMIN 4.4 06/02/2022   ALBUMIN 4.4 09/27/2021    No results found for: "MG" No results found for: "VD25OH"  No results found for: "PREALBUMIN"    Latest Ref Rng & Units 06/02/2022    1:55 PM 09/14/2021   11:57 AM  CBC EXTENDED  WBC 4.0 - 10.5 K/uL 7.4  8.8   RBC 4.22 - 5.81 Mil/uL 4.49  5.08   Hemoglobin 13.0 - 17.0 g/dL 53.6  46.8  HCT 39.0 - 52.0 % 41.4  46.9   Platelets 150.0 - 400.0 K/uL 252.0  286.0   NEUT# 1.4 - 7.7 K/uL 4.7  5.5   Lymph# 0.7 - 4.0 K/uL 2.0  2.4      There is no height or weight on file to calculate BMI.  Orders:  Orders Placed This Encounter  Procedures   MR SHOULDER RIGHT WO CONTRAST   Meds ordered this encounter  Medications   HYDROcodone-acetaminophen (NORCO/VICODIN) 5-325 MG tablet    Sig: Take 1 tablet by mouth every 4 (four) hours as needed for moderate pain.    Dispense:  20 tablet    Refill:  0     Procedures: No procedures performed  Clinical Data: No additional findings.  ROS:  All other systems negative, except as noted in the HPI. Review of Systems  Objective: Vital Signs: There were no vitals taken for this visit.  Specialty Comments:  No specialty comments available.  PMFS History: Patient Active Problem List   Diagnosis Date Noted   Night sweats 06/05/2022   Intertrigo 06/05/2022   Skin lesion of  scalp 06/05/2022   Type 2 diabetes mellitus with hyperglycemia, without long-term current use of insulin 01/22/2022   Polyarthralgia 01/20/2022   Hyperlipidemia associated with type 2 diabetes mellitus 08/30/2021   Primary osteoarthritis involving multiple joints 08/30/2021   Primary insomnia 08/30/2021   Snoring 08/30/2021   Sensorineural hearing loss (SNHL) of both ears 02/28/2017   Past Medical History:  Diagnosis Date   Arthritis    BILATERAL   Carcinogenic effect 08/30/2021   Cataract    BILATERAL   Deaf    COCHLEAR IMPLANT REQUIRED   Explosion and rupture of other specified pressurized devices, sequela 08/30/2021   Family history of cancer 08/30/2021   GERD (gastroesophageal reflux disease)    History of hand surgery 08/30/2021   History of left hip replacement 08/30/2021   Hyperlipidemia    Hyponatremia 09/27/2021   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 08/30/2021   Perineum pain, male 08/30/2021    Family History  Problem Relation Age of Onset   Colon polyps Mother    Colon polyps Father    Cancer Father    Colon cancer Neg Hx    Crohn's disease Neg Hx    Esophageal cancer Neg Hx    Rectal cancer Neg Hx    Stomach cancer Neg Hx     Past Surgical History:  Procedure Laterality Date   HAND SURGERY Bilateral    HEAD SUTURES     HIP REPLACEMENT Left    URINARY TRACT SURGERY     AS A CHILD   Social History   Occupational History   Not on file  Tobacco Use   Smoking status: Former    Packs/day: 2.00    Years: 30.00    Additional pack years: 0.00    Total pack years: 60.00    Types: Cigarettes    Quit date: 2022    Years since quitting: 2.2   Smokeless tobacco: Never  Vaping Use   Vaping Use: Never used  Substance and Sexual Activity   Alcohol use: Yes    Comment: occ   Drug use: Never   Sexual activity: Yes

## 2022-08-01 ENCOUNTER — Ambulatory Visit
Admission: RE | Admit: 2022-08-01 | Discharge: 2022-08-01 | Disposition: A | Discharge status: Home / Self Care | Payer: 59 | Source: Ambulatory Visit | Attending: Physician Assistant | Admitting: Physician Assistant

## 2022-08-01 DIAGNOSIS — M75121 Complete rotator cuff tear or rupture of right shoulder, not specified as traumatic: Secondary | ICD-10-CM

## 2022-08-03 ENCOUNTER — Telehealth: Payer: Self-pay | Admitting: Physician Assistant

## 2022-08-03 NOTE — Telephone Encounter (Signed)
Patient states he is unable to have Mri due to Cochleare implants. Please advise

## 2022-08-04 NOTE — Telephone Encounter (Signed)
Scheduled

## 2022-08-08 ENCOUNTER — Ambulatory Visit (INDEPENDENT_AMBULATORY_CARE_PROVIDER_SITE_OTHER): Payer: 59 | Admitting: Orthopedic Surgery

## 2022-08-08 ENCOUNTER — Encounter: Payer: Self-pay | Admitting: Orthopedic Surgery

## 2022-08-08 DIAGNOSIS — M12811 Other specific arthropathies, not elsewhere classified, right shoulder: Secondary | ICD-10-CM

## 2022-08-08 MED ORDER — MELOXICAM 15 MG PO TABS: ORAL_TABLET | ORAL | 2 refills | Status: DC

## 2022-08-08 MED ORDER — MELOXICAM 15 MG PO TABS: 15.0000 mg | ORAL_TABLET | Freq: Every day | ORAL | 0 refills | Status: AC

## 2022-08-08 NOTE — Progress Notes (Signed)
Office Visit Note   Patient: Scott Shannon.           Date of Birth: 01-29-1966           MRN: [id number] Visit Date: 08/08/2022 Requested by: Garnette Gunner, MD [address] [place],  [place] [address] PCP: Garnette Gunner, MD  Subjective: Chief Complaint  Patient presents with   Right Shoulder - Pain    HPI: Scott Goga. is a 57 y.o. male who presents to the office reporting right shoulder pain.  Was scheduled to have an MRI scan but cannot do that because of a cochlear implant.  Describes decreased and painful range of motion.  No prior surgery on the right shoulder.  He has had his left hip replaced.  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  Felt a pop as he was lowering a 75 pound radiator.  Did not really have pain until 2 hours later.  States that his arm gives out now.  Weakness is a new finding for him.  Takes Mobic in the morning and Norco at night.  Symptoms are worse with abduction.  Denies any neck pain or radicular symptoms.  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                ROS: All systems reviewed are negative as they relate to the chief complaint within the history of present illness.  Patient denies fevers or chills.  Assessment & Plan: Visit Diagnoses:  1. Rotator cuff arthropathy of right shoulder     Plan: Impression is probable right shoulder subscap weakness following injury.  He is not really in a place where he can modify his work activity which would be required if he underwent reverse replacement.  Does have some component of compensated rotator cuff arthropathy until this latest event.  We are going to try Mobic which has helped him in the past.  If his symptoms worsen we could also consider an intra-articular injection for pain relief but that has not been helping strength and I would hesitate to do that now so close to the time of injury.  Follow-up in about 6 weeks if not improving to  the level that he needs to to resume work.  Follow-Up Instructions: No follow-ups on file.   Orders:  No orders of the defined types were placed in this encounter.  Meds ordered this encounter  Medications   meloxicam (MOBIC) 15 MG tablet    Sig: Take 1 tablet (15 mg total) by mouth daily.    Dispense:  30 tablet    Refill:  0   meloxicam (MOBIC) 15 MG tablet    Sig: Po q d prn pain    Dispense:  30 tablet    Refill:  2      Procedures: No procedures performed   Clinical Data: No additional findings.  Objective: Vital Signs: There were no vitals taken for this visit.  Physical Exam:  Constitutional: Patient appears well-developed HEENT:  Head: Normocephalic Eyes:EOM are normal Neck: Normal range of motion Cardiovascular: Normal rate Pulmonary/chest: Effort normal Neurologic: Patient is alert Skin: Skin is warm Psychiatric: Patient has normal mood and affect  Ortho Exam: Ortho exam demonstrates range of motion on the left of 75/90/170.  Range of motion on the right is greater than 90 degrees with 90 and 170.  Subscap strength is weaker on the right than the  left.  External rotation strength is also slightly weaker on the right versus left.  There is a little bit of crepitus with passive range of motion at 90 degrees of abduction.  He is able to raise his arm up greater than 90 degrees of forward flexion and abduction on the right-hand side.  Popeye deformity is present.  Negative apprehension and relocation testing  Specialty Comments:  No specialty comments available.  Imaging: No results found.   PMFS History: Patient Active Problem List   Diagnosis Date Noted   Night sweats 06/05/2022   Intertrigo 06/05/2022   Skin lesion of scalp 06/05/2022   Type 2 diabetes mellitus with hyperglycemia, without long-term current use of insulin 01/22/2022   Polyarthralgia 01/20/2022   Hyperlipidemia associated with type 2 diabetes mellitus 08/30/2021   Primary  osteoarthritis involving multiple joints 08/30/2021   Primary insomnia 08/30/2021   Snoring 08/30/2021   Sensorineural hearing loss (SNHL) of both ears 02/28/2017   Past Medical History:  Diagnosis Date   Arthritis    BILATERAL   Carcinogenic effect 08/30/2021   Cataract    BILATERAL   Deaf    COCHLEAR IMPLANT REQUIRED   Explosion and rupture of other specified pressurized devices, sequela 08/30/2021   Family history of cancer 08/30/2021   GERD (gastroesophageal reflux disease)    History of hand surgery 08/30/2021   History of left hip replacement 08/30/2021   Hyperlipidemia    Hyponatremia 09/27/2021   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 08/30/2021   Perineum pain, male 08/30/2021    Family History  Problem Relation Age of Onset   Colon polyps Mother    Colon polyps Father    Cancer Father    Colon cancer Neg Hx    Crohn's disease Neg Hx    Esophageal cancer Neg Hx    Rectal cancer Neg Hx    Stomach cancer Neg Hx     Past Surgical History:  Procedure Laterality Date   HAND SURGERY Bilateral    HEAD SUTURES     HIP REPLACEMENT Left    URINARY TRACT SURGERY     AS A CHILD   Social History   Occupational History   Not on file  Tobacco Use   Smoking status: Former    Packs/day: 2.00    Years: 30.00    Additional pack years: 0.00    Total pack years: 60.00    Types: Cigarettes    Quit date: 2022    Years since quitting: 2.3   Smokeless tobacco: Never  Vaping Use   Vaping Use: Never used  Substance and Sexual Activity   Alcohol use: Yes    Comment: occ   Drug use: Never   Sexual activity: Yes

## 2022-08-10 NOTE — Transitions of Care (Post Inpatient/ED Visit) (Signed)
   08/10/2022  Name: Scott Shannon. MRN: [id number] DOB: 12/17/1965  Today's TOC FU Call Status: Today's TOC FU Call Status:: Unsuccessul Call (1st Attempt) Unsuccessful Call (1st Attempt) Date: 07/27/22  Attempted to reach the patient regarding the most recent Inpatient/ED visit.  Follow Up Plan: No further outreach attempts will be made at this time. We have been unable to contact the patient.  Signature Arvil Persons, BSN, Charity fundraiser

## 2022-08-18 ENCOUNTER — Ambulatory Visit: Payer: 59 | Admitting: Surgical

## 2022-09-04 ENCOUNTER — Ambulatory Visit: Payer: 59 | Admitting: Family Medicine

## 2022-09-18 ENCOUNTER — Ambulatory Visit (INDEPENDENT_AMBULATORY_CARE_PROVIDER_SITE_OTHER): Payer: 59 | Admitting: Family Medicine

## 2022-09-18 ENCOUNTER — Encounter: Payer: Self-pay | Admitting: Family Medicine

## 2022-09-18 VITALS — BP 126/78 | HR 79 | Temp 97.8°F | Wt 218.6 lb

## 2022-09-18 DIAGNOSIS — E118 Type 2 diabetes mellitus with unspecified complications: Secondary | ICD-10-CM

## 2022-09-18 DIAGNOSIS — R61 Generalized hyperhidrosis: Secondary | ICD-10-CM

## 2022-09-18 DIAGNOSIS — E1159 Type 2 diabetes mellitus with other circulatory complications: Secondary | ICD-10-CM | POA: Diagnosis not present

## 2022-09-18 DIAGNOSIS — L409 Psoriasis, unspecified: Secondary | ICD-10-CM | POA: Insufficient documentation

## 2022-09-18 DIAGNOSIS — E1169 Type 2 diabetes mellitus with other specified complication: Secondary | ICD-10-CM | POA: Diagnosis not present

## 2022-09-18 DIAGNOSIS — I152 Hypertension secondary to endocrine disorders: Secondary | ICD-10-CM | POA: Insufficient documentation

## 2022-09-18 DIAGNOSIS — E785 Hyperlipidemia, unspecified: Secondary | ICD-10-CM

## 2022-09-18 DIAGNOSIS — Z87891 Personal history of nicotine dependence: Secondary | ICD-10-CM | POA: Insufficient documentation

## 2022-09-18 LAB — POCT GLYCOSYLATED HEMOGLOBIN (HGB A1C): Hemoglobin A1C: 6.9 % — AB (ref 4.0–5.6)

## 2022-09-18 MED ORDER — OXYBUTYNIN CHLORIDE 5 MG PO TABS
5.0000 mg | ORAL_TABLET | Freq: Two times a day (BID) | ORAL | 0 refills | Status: AC | PRN
Start: 2022-09-18 — End: 2022-10-18

## 2022-09-18 NOTE — Assessment & Plan Note (Signed)
Continue annual lung cancer screening, next due in August. Continue smoking cessation efforts if applicable.

## 2022-09-18 NOTE — Assessment & Plan Note (Signed)
Controlled with diet and exercise Continue to monitor in 3 months.

## 2022-09-18 NOTE — Assessment & Plan Note (Signed)
Prescribe oxybutynin 5 mg as needed. Reassess effectiveness and side effects at the next visit or sooner if symptoms persist.

## 2022-09-18 NOTE — Assessment & Plan Note (Signed)
Controlled on olmesartan 5 mg.  Continue current therapy.

## 2022-09-18 NOTE — Progress Notes (Signed)
Assessment/Plan:   Problem List Items Addressed This Visit       Cardiovascular and Mediastinum   Hypertension associated with diabetes (HCC)    Controlled on olmesartan 5 mg.  Continue current therapy.        Endocrine   Hyperlipidemia associated with type 2 diabetes mellitus (HCC)   Controlled type 2 diabetes mellitus with complication, without long-term current use of insulin (HCC) - Primary    Controlled with diet and exercise Continue to monitor in 3 months.      Relevant Orders   POCT glycosylated hemoglobin (Hb A1C) (Completed)     Musculoskeletal and Integument   Hyperhidrosis    Prescribe oxybutynin 5 mg as needed. Reassess effectiveness and side effects at the next visit or sooner if symptoms persist.      Relevant Medications   oxybutynin (DITROPAN) 5 MG tablet   Psoriasis    Continue current use of clobetasol and triamcinolone as prescribed by the dermatologist. Follow up with dermatologist as needed.        Other   History of tobacco use    Continue annual lung cancer screening, next due in August. Continue smoking cessation efforts if applicable.       Medications Discontinued During This Encounter  Medication Reason   metFORMIN (GLUCOPHAGE) 500 MG tablet    aspirin EC 81 MG tablet    Zinc Oxide 30 % OINT    HYDROcodone-acetaminophen (NORCO/VICODIN) 5-325 MG tablet     Return in about 3 months (around 12/19/2022), or or if sweating worsens or fail to improve, for BP, DM, HLD.    Subjective:   Encounter date: 09/18/2022  Scott Shannon. is a 57 y.o. male who has Sensorineural hearing loss (SNHL) of both ears; Hyperlipidemia associated with type 2 diabetes mellitus (HCC); Primary osteoarthritis involving multiple joints; Primary insomnia; Snoring; Controlled type 2 diabetes mellitus with complication, without long-term current use of insulin (HCC); Hyperhidrosis; Hypertension associated with diabetes (HCC); Psoriasis; and History of tobacco  use on their problem list..   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   CHIEF COMPLAINT: Follow-up on diabetes, blood pressure, rash, and excessive sweating.  HISTORY OF PRESENT ILLNESS:  Diabetes. The patient improvement in A1c from 7.1 to 6.9.  He has been focusing on diet and exercise. He checks his blood sugars intermittently at home. No symptoms of excessive thirst, polyuria, chest pain, shortness of breath, or shakiness are reported. The patient is managing his diabetes with lifestyle changes and is not currently on metformin due to a history of gastrointestinal upset causing diarrhea.  Blood Pressure. The patient's blood pressure is well-controlled at 126/78 mmHg, with no new complaints reported. He continues to work on diet and exercise.  Rash. The patient is being treated by a dermatologist for psoriasis with clobetasol and triamcinolone creams. He reports improvement in both perineal and scalp areas, with reduced itching and burning.  Excessive Sweating (Hyperhidrosis). The patient reports sweating excessively in the waist and inguinal area, particularly at night. He expresses interest in starting medication for this condition.   Review of Systems  Constitutional:  Positive for diaphoresis. Negative for chills, fever, malaise/fatigue and weight loss.  HENT:  Negative for congestion, ear discharge, ear pain and hearing loss.   Eyes:  Negative for blurred vision, double vision, photophobia, pain, discharge and redness.  Respiratory:  Negative for  cough, sputum production, shortness of breath and wheezing.   Cardiovascular:  Negative for chest pain and palpitations.  Gastrointestinal:  Negative for abdominal pain, blood in stool, constipation, diarrhea, heartburn, melena, nausea and vomiting.  Genitourinary:  Negative for dysuria, flank pain, frequency, hematuria and urgency.  Musculoskeletal:  Negative for myalgias.  Skin:  Positive for rash. Negative for itching.  Neurological:  Negative for dizziness, tingling, tremors, speech change, seizures, loss of consciousness, weakness and headaches.  Psychiatric/Behavioral:  Negative for depression, hallucinations, memory loss, substance abuse and suicidal ideas. The patient does not have insomnia.   All other systems reviewed and are negative.   Past Surgical History:  Procedure Laterality Date   HAND SURGERY Bilateral    HEAD SUTURES     HIP REPLACEMENT Left    URINARY TRACT SURGERY     AS A CHILD    Outpatient Medications Prior to Visit  Medication Sig Dispense Refill   Blood Glucose Monitoring Suppl DEVI 1 each by Does not apply route in the morning, at noon, and at bedtime. May substitute to any manufacturer covered by patient's insurance. 1 each 0   clobetasol (TEMOVATE) 0.05 % external solution Apply topically.     meloxicam (MOBIC) 15 MG tablet Po q d prn pain 30 tablet 2   Multiple Vitamin (MULTIVITAMIN ADULT PO) Take by mouth daily. TAKE ONE DAILY     olmesartan (BENICAR) 5 MG tablet Take 1 tablet (5 mg total) by mouth daily. 90 tablet 3   rosuvastatin (CRESTOR) 10 MG tablet Take 1 tablet by mouth once daily 90 tablet 0   triamcinolone cream (KENALOG) 0.1 % Apply topically.     aspirin EC 81 MG tablet Take 1 tablet (81 mg total) by mouth daily. Swallow whole. (Patient not taking: Reported on 09/18/2022) 30 tablet 12   HYDROcodone-acetaminophen (NORCO/VICODIN) 5-325 MG tablet Take 1 tablet by mouth every 4 (four) hours as needed for moderate pain. (Patient not taking: Reported  on 09/18/2022) 20 tablet 0   metFORMIN (GLUCOPHAGE) 500 MG tablet Take 1 tablet (500 mg total) by mouth 2 (two) times daily with a meal. (Patient not taking: Reported on 09/18/2022) 180 tablet 3   Zinc Oxide 30 % OINT Apply 1 g topically daily. (Patient not taking: Reported on 06/02/2022) 57 g 0   No facility-administered medications prior to visit.    Family History  Problem Relation Age of Onset   Colon polyps Mother    Colon polyps Father    Cancer Father    Colon cancer Neg Hx    Crohn's disease Neg Hx    Esophageal cancer Neg Hx    Rectal cancer Neg Hx    Stomach cancer Neg Hx     Social History   Socioeconomic History   Marital status: Married    Spouse name: Not on file   Number of children: Not on file   Years of education: Not on file   Highest education level: 12th grade  Occupational History   Not on file  Tobacco Use   Smoking status: Former    Packs/day: 2.00    Years: 30.00    Additional pack years: 0.00    Total pack years: 60.00    Types: Cigarettes    Quit date: 2022    Years since quitting: 2.4  Smokeless tobacco: Never  Vaping Use   Vaping Use: Never used  Substance and Sexual Activity   Alcohol use: Yes    Comment: occ   Drug use: Never   Sexual activity: Yes  Other Topics Concern   Not on file  Social History Narrative   Not on file   Social Determinants of Health   Financial Resource Strain: Low Risk  (09/17/2022)   Overall Financial Resource Strain (CARDIA)    Difficulty of Paying Living Expenses: Not hard at all  Food Insecurity: Food Insecurity Present (09/01/2022)   Hunger Vital Sign    Worried About Running Out of Food in the Last Year: Sometimes true    Ran Out of Food in the Last Year: Sometimes true  Transportation Needs: No Transportation Needs (09/01/2022)   PRAPARE - Administrator, Civil Service (Medical): No    Lack of Transportation (Non-Medical): No  Physical Activity: Insufficiently Active (09/01/2022)   Exercise  Vital Sign    Days of Exercise per Week: 3 days    Minutes of Exercise per Session: 30 min  Stress: No Stress Concern Present (09/01/2022)   [employer] of Occupational Health - Occupational Stress Questionnaire    Feeling of Stress : Only a little  Social Connections: Socially Isolated (09/01/2022)   Social Connection and Isolation Panel [NHANES]    Frequency of Communication with Friends and Family: Never    Frequency of Social Gatherings with Friends and Family: Never    Attends Religious Services: Never    Database administrator or Organizations: No    Attends Banker Meetings: Never    Marital Status: Married  Catering manager Violence: Not At Risk (12/07/2021)   Humiliation, Afraid, Rape, and Kick questionnaire    Fear of Current or Ex-Partner: No    Emotionally Abused: No    Physically Abused: No    Sexually Abused: No                                                                                                  Objective:  Physical Exam: BP 126/78 (BP Location: Left Arm, Patient Position: Sitting, Cuff Size: Large)   Pulse 79   Temp 97.8 F (36.6 C) (Temporal)   Wt 218 lb 9.6 oz (99.2 kg)   SpO2 99%   BMI 30.49 kg/m   Wt Readings from Last 3 Encounters:  09/18/22 218 lb 9.6 oz (99.2 kg)  06/02/22 220 lb 9.6 oz (100.1 kg)  01/16/22 217 lb 3.2 oz (98.5 kg)     Physical Exam Constitutional:      Appearance: Normal appearance.  HENT:     Head: Normocephalic and atraumatic.     Right Ear: Hearing normal.     Left Ear: Hearing normal.     Nose: Nose normal.  Eyes:     General: No scleral icterus.       Right eye: No discharge.        Left eye: No discharge.     Extraocular Movements: Extraocular movements intact.  Cardiovascular:     Rate and  Rhythm: Normal rate and regular rhythm.     Heart sounds: Normal heart sounds.  Pulmonary:     Effort: Pulmonary effort is normal.     Breath sounds: Normal breath sounds.  Abdominal:      Palpations: Abdomen is soft.     Tenderness: There is no abdominal tenderness.  Skin:    General: Skin is warm.     Findings: No rash.  Neurological:     General: No focal deficit present.     Mental Status: He is alert.     Cranial Nerves: No cranial nerve deficit.  Psychiatric:        Mood and Affect: Mood normal.        Behavior: Behavior normal.        Thought Content: Thought content normal.        Judgment: Judgment normal.    Diabetic Foot Exam - Simple   Simple Foot Form Diabetic Foot exam was performed with the following findings: Yes 09/18/2022  3:09 PM  Visual Inspection No deformities, no ulcerations, no other skin breakdown bilaterally: Yes Sensation Testing Intact to touch and monofilament testing bilaterally: Yes Pulse Check Posterior Tibialis and Dorsalis pulse intact bilaterally: Yes Comments      DG Shoulder Right  Result Date: 07/26/2022 CLINICAL DATA:  Sharp pain and superior aspect of right shoulder for 2 days. Felt pain after helping move a heavy air conditioner/radiator unit. EXAM: RIGHT SHOULDER - 2+ VIEW COMPARISON:  None Available. FINDINGS: There is diffuse decreased bone mineralization. Mild glenohumeral joint space narrowing and inferior degenerative spurring. Moderate anterior and posterior peripheral glenoid degenerative osteophytosis. The humeral head is high-riding and contacts the undersurface of the acromion. There is moderate concave scalloping of the undersurface of the acromion. 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 The clavicular head appears to be 8 mm superiorly displaced with respect to the acromion on frontal view. No acute fracture is seen.  No dislocation. IMPRESSION: 1. Mild glenohumeral osteoarthritis. 2. The humeral head is high-riding and contacts the undersurface of the acromion. This is suspicious for a full-thickness superior rotator cuff tear. Electronically Signed   By: Neita Garnet M.D.   On: 07/26/2022  10:35    Recent Results (from the past 2160 hour(s))  POCT glycosylated hemoglobin (Hb A1C)     Status: Abnormal   Collection Time: 09/18/22  2:46 PM  Result Value Ref Range   Hemoglobin A1C 6.9 (A) 4.0 - 5.6 %   HbA1c POC (<> result, manual entry)     HbA1c, POC (prediabetic range)     HbA1c, POC (controlled diabetic range)          Garner Nash, MD, MS

## 2022-09-18 NOTE — Assessment & Plan Note (Signed)
Continue current use of clobetasol and triamcinolone as prescribed by the dermatologist. Follow up with dermatologist as needed.

## 2022-09-18 NOTE — Patient Instructions (Signed)
Continue managing your diabetes with a healthy diet and regular exercise. Your current regimen is effective in maintaining your blood sugar levels.  If you experience excessive sweating, start taking oxybutynin 5 mg at night. Monitor how it affects your symptoms

## 2022-10-28 ENCOUNTER — Other Ambulatory Visit: Payer: Self-pay | Admitting: Family Medicine

## 2022-10-28 DIAGNOSIS — E782 Mixed hyperlipidemia: Secondary | ICD-10-CM

## 2022-10-30 NOTE — Telephone Encounter (Signed)
Chart supports rx. Last OV: 09/18/2022 Next OV: 12/19/2022

## 2022-11-13 ENCOUNTER — Ambulatory Visit (INDEPENDENT_AMBULATORY_CARE_PROVIDER_SITE_OTHER): Payer: 59

## 2022-11-13 DIAGNOSIS — Z0000 Encounter for general adult medical examination without abnormal findings: Secondary | ICD-10-CM | POA: Diagnosis not present

## 2022-11-13 NOTE — Patient Instructions (Signed)
Scott Shannon , Thank you for taking time to come for your Medicare Wellness Visit. I appreciate your ongoing commitment to your health goals. Please review the following plan we discussed and let me know if I can assist you in the future.   Referrals/Orders/Follow-Ups/Clinician Recommendations: none  This is a list of the screening recommended for you and due dates:  Health Maintenance  Topic Date Due   COVID-19 Vaccine (3 - Pfizer risk series) 01/15/2020   Screening for Lung Cancer  11/30/2022   Flu Shot  11/16/2022   Yearly kidney health urinalysis for diabetes  01/17/2023   Hemoglobin A1C  03/20/2023   Eye exam for diabetics  04/14/2023   Yearly kidney function blood test for diabetes  06/03/2023   Complete foot exam   09/18/2023   Medicare Annual Wellness Visit  11/13/2023   Colon Cancer Screening  11/14/2028   Hepatitis C Screening  Completed   HIV Screening  Completed   HPV Vaccine  Aged Out   DTaP/Tdap/Td vaccine  Discontinued   Zoster (Shingles) Vaccine  Discontinued    Advanced directives: (ACP Link)Information on Advanced Care Planning can be found at [facility] [place] Advance Health Care Directives Advance Health Care Directives ([email or web address]   Next Medicare Annual Wellness Visit scheduled for next year: Yes  Preventive Care 40-64 Years, Male Preventive care refers to lifestyle choices and visits with your health care provider that can promote health and wellness. What does preventive care include? A yearly physical exam. This is also called an annual well check. Dental exams once or twice a year. Routine eye exams. Ask your health care provider how often you should have your eyes checked. Personal lifestyle choices, including: Daily care of your teeth and gums. Regular physical activity. Eating a healthy diet. Avoiding tobacco and drug use. Limiting alcohol use. Practicing safe sex. Taking low-dose aspirin every day starting at age 55. What happens  during an annual well check? The services and screenings done by your health care provider during your annual well check will depend on your age, overall health, lifestyle risk factors, and family history of disease. Counseling  Your health care provider may ask you questions about your: Alcohol use. Tobacco use. Drug use. Emotional well-being. Home and relationship well-being. Sexual activity. Eating habits. Work and work Astronomer. Screening  You may have the following tests or measurements: Height, weight, and BMI. Blood pressure. Lipid and cholesterol levels. These may be checked every 5 years, or more frequently if you are over 38 years old. Skin check. Lung cancer screening. You may have this screening every year starting at age 69 if you have a 30-pack-year history of smoking and currently smoke or have quit within the past 15 years. Fecal occult blood test (FOBT) of the stool. You may have this test every year starting at age 23. Flexible sigmoidoscopy or colonoscopy. You may have a sigmoidoscopy every 5 years or a colonoscopy every 10 years starting at age 109. Prostate cancer screening. Recommendations will vary depending on your family history and other risks. Hepatitis C blood test. Hepatitis B blood test. Sexually transmitted disease (STD) testing. Diabetes screening. This is done by checking your blood sugar (glucose) after you have not eaten for a while (fasting). You may have this done every 1-3 years. Discuss your test results, treatment options, and if necessary, the need for more tests with your health care provider. Vaccines  Your health care provider may recommend certain vaccines, such as: Influenza vaccine. This  is recommended every year. Tetanus, diphtheria, and acellular pertussis (Tdap, Td) vaccine. You may need a Td booster every 10 years. Zoster vaccine. You may need this after age 27. Pneumococcal 13-valent conjugate (PCV13) vaccine. You may need this if  you have certain conditions and have not been vaccinated. Pneumococcal polysaccharide (PPSV23) vaccine. You may need one or two doses if you smoke cigarettes or if you have certain conditions. Talk to your health care provider about which screenings and vaccines you need and how often you need them. This information is not intended to replace advice given to you by your health care provider. Make sure you discuss any questions you have with your health care provider. Document Released: 04/30/2015 Document Revised: 12/22/2015 Document Reviewed: 02/02/2015 Elsevier Interactive Patient Education  2017 [employer].  Fall Prevention in the Home Falls can cause injuries. They can happen to people of all ages. There are many things you can do to make your home safe and to help prevent falls. What can I do on the outside of my home? Regularly fix the edges of walkways and driveways and fix any cracks. Remove anything that might make you trip as you walk through a door, such as a raised step or threshold. Trim any bushes or trees on the path to your home. Use bright outdoor lighting. Clear any walking paths of anything that might make someone trip, such as rocks or tools. Regularly check to see if handrails are loose or broken. Make sure that both sides of any steps have handrails. Any raised decks and porches should have guardrails on the edges. Have any leaves, snow, or ice cleared regularly. Use sand or salt on walking paths during winter. Clean up any spills in your garage right away. This includes oil or grease spills. What can I do in the bathroom? Use night lights. Install grab bars by the toilet and in the tub and shower. Do not use towel bars as grab bars. Use non-skid mats or decals in the tub or shower. If you need to sit down in the shower, use a plastic, non-slip stool. Keep the floor dry. Clean up any water that spills on the floor as soon as it happens. Remove soap buildup in the  tub or shower regularly. Attach bath mats securely with double-sided non-slip rug tape. Do not have throw rugs and other things on the floor that can make you trip. What can I do in the bedroom? Use night lights. Make sure that you have a light by your bed that is easy to reach. Do not use any sheets or blankets that are too big for your bed. They should not hang down onto the floor. Have a firm chair that has side arms. You can use this for support while you get dressed. Do not have throw rugs and other things on the floor that can make you trip. What can I do in the kitchen? Clean up any spills right away. Avoid walking on wet floors. Keep items that you use a lot in easy-to-reach places. If you need to reach something above you, use a strong step stool that has a grab bar. Keep electrical cords out of the way. Do not use floor polish or wax that makes floors slippery. If you must use wax, use non-skid floor wax. Do not have throw rugs and other things on the floor that can make you trip. What can I do with my stairs? Do not leave any items on the stairs. Make  sure that there are handrails on both sides of the stairs and use them. Fix handrails that are broken or loose. Make sure that handrails are as long as the stairways. Check any carpeting to make sure that it is firmly attached to the stairs. Fix any carpet that is loose or worn. Avoid having throw rugs at the top or bottom of the stairs. If you do have throw rugs, attach them to the floor with carpet tape. Make sure that you have a light switch at the top of the stairs and the bottom of the stairs. If you do not have them, ask someone to add them for you. What else can I do to help prevent falls? Wear shoes that: Do not have high heels. Have rubber bottoms. Are comfortable and fit you well. Are closed at the toe. Do not wear sandals. If you use a stepladder: Make sure that it is fully opened. Do not climb a closed  stepladder. Make sure that both sides of the stepladder are locked into place. Ask someone to hold it for you, if possible. Clearly mark and make sure that you can see: Any grab bars or handrails. First and last steps. Where the edge of each step is. Use tools that help you move around (mobility aids) if they are needed. These include: Canes. Walkers. Scooters. Crutches. Turn on the lights when you go into a dark area. Replace any light bulbs as soon as they burn out. Set up your furniture so you have a clear path. Avoid moving your furniture around. If any of your floors are uneven, fix them. If there are any pets around you, be aware of where they are. Review your medicines with your doctor. Some medicines can make you feel dizzy. This can increase your chance of falling. Ask your doctor what other things that you can do to help prevent falls. This information is not intended to replace advice given to you by your health care provider. Make sure you discuss any questions you have with your health care provider. Document Released: 01/28/2009 Document Revised: 09/09/2015 Document Reviewed: 05/08/2014 Elsevier Interactive Patient Education  2017 [employer].

## 2022-11-13 NOTE — Progress Notes (Signed)
Subjective:   Scott Boelter. is a 57 y.o. male who presents for Medicare Annual/Subsequent preventive examination.  Visit Complete: Virtual  I connected with  Scott Shannon. on 11/13/22 by a audio enabled telemedicine application and verified that I am speaking with the correct person using two identifiers.  Patient Location: Home  Provider Location: Office/Clinic  I discussed the limitations of evaluation and management by telemedicine. The patient expressed understanding and agreed to proceed.  Vital Signs: Patient was unable to self-report vital signs via telehealth due to a lack of equipment at home.  Review of Systems     Cardiac Risk Factors include: advanced age (>9men, >79 women);diabetes mellitus;dyslipidemia;hypertension;male gender     Objective:    Today's Vitals   There is no height or weight on file to calculate BMI.     11/13/2022    3:02 PM 07/26/2022   10:00 AM 12/07/2021    3:09 PM  Advanced Directives  Does Patient Have a Medical Advance Directive? No No No  Would patient like information on creating a medical advance directive?  No - Patient declined No - Patient declined    Current Medications (verified) Outpatient Encounter Medications as of 11/13/2022  Medication Sig   Blood Glucose Monitoring Suppl DEVI 1 each by Does not apply route in the morning, at noon, and at bedtime. May substitute to any manufacturer covered by patient's insurance.   clobetasol (TEMOVATE) 0.05 % external solution Apply topically.   meloxicam (MOBIC) 15 MG tablet Po q d prn pain   Multiple Vitamin (MULTIVITAMIN ADULT PO) Take by mouth daily. TAKE ONE DAILY   olmesartan (BENICAR) 5 MG tablet Take 1 tablet (5 mg total) by mouth daily.   rosuvastatin (CRESTOR) 10 MG tablet Take 1 tablet by mouth once daily   triamcinolone cream (KENALOG) 0.1 % Apply topically.   No facility-administered encounter medications on file as of 11/13/2022.    Allergies  (verified) Amoxicillin and Metformin and related   History: Past Medical History:  Diagnosis Date   Arthritis    BILATERAL   Carcinogenic effect 08/30/2021   Cataract    BILATERAL   Deaf    COCHLEAR IMPLANT REQUIRED   Explosion and rupture of other specified pressurized devices, sequela 08/30/2021   Family history of cancer 08/30/2021   GERD (gastroesophageal reflux disease)    History of hand surgery 08/30/2021   History of left hip replacement 08/30/2021   Hyperlipidemia    Hyponatremia 09/27/2021   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 08/30/2021   Perineum pain, male 08/30/2021   Polyarthralgia 01/20/2022   Past Surgical History:  Procedure Laterality Date   HAND SURGERY Bilateral    HEAD SUTURES     HIP REPLACEMENT Left    URINARY TRACT SURGERY     AS A CHILD   Family History  Problem Relation Age of Onset   Colon polyps Mother    Colon polyps Father    Cancer Father    Colon cancer Neg Hx    Crohn's disease Neg Hx    Esophageal cancer Neg Hx    Rectal cancer Neg Hx    Stomach cancer Neg Hx    Social History   Socioeconomic History   Marital status: Married    Spouse name: Not on file   Number of children: Not on file   Years of education: Not on file   Highest education level: 12th grade  Occupational History   Not on file  Tobacco Use  Smoking status: Former    Current packs/day: 0.00    Average packs/day: 2.0 packs/day for 30.0 years (60.0 ttl pk-yrs)    Types: Cigarettes    Start date: 44    Quit date: 2022    Years since quitting: 2.5   Smokeless tobacco: Never  Vaping Use   Vaping status: Never Used  Substance and Sexual Activity   Alcohol use: Yes    Comment: occ   Drug use: Never   Sexual activity: Yes  Other Topics Concern   Not on file  Social History Narrative   Not on file   Social Determinants of Health   Financial Resource Strain: Low Risk  (11/13/2022)   Overall Financial Resource Strain (CARDIA)    Difficulty of Paying Living  Expenses: Not hard at all  Food Insecurity: No Food Insecurity (11/13/2022)   Hunger Vital Sign    Worried About Running Out of Food in the Last Year: Never true    Ran Out of Food in the Last Year: Never true  Recent Concern: Food Insecurity - Food Insecurity Present (09/01/2022)   Hunger Vital Sign    Worried About Running Out of Food in the Last Year: Sometimes true    Ran Out of Food in the Last Year: Sometimes true  Transportation Needs: No Transportation Needs (11/13/2022)   PRAPARE - Administrator, Civil Service (Medical): No    Lack of Transportation (Non-Medical): No  Physical Activity: Sufficiently Active (11/13/2022)   Exercise Vital Sign    Days of Exercise per Week: 4 days    Minutes of Exercise per Session: 60 min  Recent Concern: Physical Activity - Insufficiently Active (09/01/2022)   Exercise Vital Sign    Days of Exercise per Week: 3 days    Minutes of Exercise per Session: 30 min  Stress: No Stress Concern Present (11/13/2022)   [employer] of Occupational Health - Occupational Stress Questionnaire    Feeling of Stress : Not at all  Social Connections: Socially Isolated (11/13/2022)   Social Connection and Isolation Panel [NHANES]    Frequency of Communication with Friends and Family: Never    Frequency of Social Gatherings with Friends and Family: Never    Attends Religious Services: Never    Database administrator or Organizations: No    Attends Engineer, structural: Never    Marital Status: Married    Tobacco Counseling Counseling given: Not Answered   Clinical Intake:  Pre-visit preparation completed: Yes  Pain : No/denies pain     Nutritional Risks: None Diabetes: Yes CBG done?: No Did pt. bring in CBG monitor from home?: No  How often do you need to have someone help you when you read instructions, pamphlets, or other written materials from your doctor or pharmacy?: 1 - Never  Interpreter Needed?: No  Information  entered by :: NAllen LPN   Activities of Daily Living    11/13/2022    2:57 PM 12/07/2021    3:13 PM  In your present state of health, do you have any difficulty performing the following activities:  Hearing? 1 0  Comment deaf in one ear and cochlear implant   Vision? 0 0  Difficulty concentrating or making decisions? 0 0  Walking or climbing stairs? 0 0  Dressing or bathing? 0 0  Doing errands, shopping? 0 0  Preparing Food and eating ? N N  Using the Toilet? N N  In the past six months, have you accidently  leaked urine? N N  Do you have problems with loss of bowel control? N N  Managing your Medications? N N  Managing your Finances? N N  Housekeeping or managing your Housekeeping? N N    Patient Care Team: Garnette Gunner, MD as PCP - General (Family Medicine) August Saucer Corrie Mckusick, MD as Consulting Physician (Orthopedic Surgery)  Indicate any recent Medical Services you may have received from other than [facility] providers in the past year (date may be approximate).     Assessment:   This is a routine wellness examination for Scott Shannon.  Hearing/Vision screen Hearing Screening - Comments:: Deaf in one ears and has cochlear implant Vision Screening - Comments:: Regular eye exams,   Dietary issues and exercise activities discussed:     Goals Addressed             This Visit's Progress    Patient Stated       11/13/2022, maintain       Depression Screen    11/13/2022    3:04 PM 12/07/2021    3:10 PM 12/07/2021    3:07 PM 09/14/2021   11:12 AM 08/30/2021    9:22 AM  PHQ 2/9 Scores  PHQ - 2 Score 0 0 0 0 0  PHQ- 9 Score 0        Fall Risk    11/13/2022    3:03 PM 06/02/2022    1:15 PM 12/07/2021    3:10 PM 12/07/2021    1:50 PM 12/05/2021   10:02 AM  Fall Risk   Falls in the past year? 0 0 0 0 0  Number falls in past yr: 0 0 0 0 0  Injury with Fall? 0 0 0 0 0  Risk for fall due to : Medication side effect      Follow up Falls prevention discussed;Falls  evaluation completed  Falls evaluation completed;Education provided      MEDICARE RISK AT HOME:  Medicare Risk at Home - 11/13/22 1503     Any stairs in or around the home? Yes    If so, are there any without handrails? No    Home free of loose throw rugs in walkways, pet beds, electrical cords, etc? Yes    Adequate lighting in your home to reduce risk of falls? Yes    Life alert? No    Use of a cane, walker or w/c? No    Grab bars in the bathroom? Yes    Shower chair or bench in shower? No    Elevated toilet seat or a handicapped toilet? No             TIMED UP AND GO:  Was the test performed?  No    Cognitive Function:        11/13/2022    3:04 PM 12/07/2021    3:14 PM  6CIT Screen  What Year? 0 points 0 points  What month? 0 points 0 points  What time? 0 points 0 points  Count back from 20 0 points 0 points  Months in reverse 4 points 0 points  Repeat phrase 4 points 0 points  Total Score 8 points 0 points    Immunizations Immunization History  Administered Date(s) Administered   [employer] Covid-19 Vaccine Bivalent Booster 79yrs & up 11/19/2019, 12/18/2019    TDAP status: Due, Education has been provided regarding the importance of this vaccine. Advised may receive this vaccine at local pharmacy or Health Dept. Aware to provide a copy  of the vaccination record if obtained from local pharmacy or Health Dept. Verbalized acceptance and understanding.  Flu Vaccine status: Up to date  Pneumococcal vaccine status: Up to date  Covid-19 vaccine status: Completed vaccines  Qualifies for Shingles Vaccine? Yes   Zostavax completed No   Shingrix Completed?: No.    Education has been provided regarding the importance of this vaccine. Patient has been advised to call insurance company to determine out of pocket expense if they have not yet received this vaccine. Advised may also receive vaccine at local pharmacy or Health Dept. Verbalized acceptance and  understanding.  Screening Tests Health Maintenance  Topic Date Due   COVID-19 Vaccine (3 - Pfizer risk series) 01/15/2020   Lung Cancer Screening  11/30/2022   INFLUENZA VACCINE  11/16/2022   Diabetic kidney evaluation - Urine ACR  01/17/2023   HEMOGLOBIN A1C  03/20/2023   OPHTHALMOLOGY EXAM  04/14/2023   Diabetic kidney evaluation - eGFR measurement  06/03/2023   FOOT EXAM  09/18/2023   Medicare Annual Wellness (AWV)  11/13/2023   Colonoscopy  11/14/2028   Hepatitis C Screening  Completed   HIV Screening  Completed   HPV VACCINES  Aged Out   DTaP/Tdap/Td  Discontinued   Zoster Vaccines- Shingrix  Discontinued    Health Maintenance  Health Maintenance Due  Topic Date Due   COVID-19 Vaccine (3 - Pfizer risk series) 01/15/2020   Lung Cancer Screening  11/30/2022    Colorectal cancer screening: Type of screening: Colonoscopy. Completed 11/14/2021. Repeat every 10 years  Lung Cancer Screening: (Low Dose CT Chest recommended if Age 72-80 years, 20 pack-year currently smoking OR have quit w/in 15years.) does qualify.   Lung Cancer Screening Referral: CT scan 11/29/2021  Additional Screening:  Hepatitis C Screening: does qualify; Completed 01/20/2022  Vision Screening: Recommended annual ophthalmology exams for early detection of glaucoma and other disorders of the eye. Is the patient up to date with their annual eye exam?  Yes  Who is the provider or what is the name of the office in which the patient attends annual eye exams? Can't remember name If pt is not established with a provider, would they like to be referred to a provider to establish care? No .   Dental Screening: Recommended annual dental exams for proper oral hygiene  Diabetic Foot Exam: Diabetic Foot Exam: Completed 09/18/2022  Community Resource Referral / Chronic Care Management: CRR required this visit?  No   CCM required this visit?  No     Plan:     I have personally reviewed and noted the following  in the patient's chart:   Medical and social history Use of alcohol, tobacco or illicit drugs  Current medications and supplements including opioid prescriptions. Patient is not currently taking opioid prescriptions. Functional ability and status Nutritional status Physical activity Advanced directives List of other physicians Hospitalizations, surgeries, and ER visits in previous 12 months Vitals Screenings to include cognitive, depression, and falls Referrals and appointments  In addition, I have reviewed and discussed with patient certain preventive protocols, quality metrics, and best practice recommendations. A written personalized care plan for preventive services as well as general preventive health recommendations were provided to patient.     Barb Merino, LPN   6/38/7564   After Visit Summary: (MyChart) Due to this being a telephonic visit, the after visit summary with patients personalized plan was offered to patient via MyChart   Nurse Notes: none

## 2022-11-20 ENCOUNTER — Ambulatory Visit
Admission: RE | Admit: 2022-11-20 | Discharge: 2022-11-20 | Disposition: A | Discharge status: Home / Self Care | Payer: 59 | Source: Ambulatory Visit | Attending: Internal Medicine | Admitting: Internal Medicine

## 2022-11-20 VITALS — BP 134/87 | HR 59 | Temp 98.8°F | Resp 18 | Ht 71.0 in | Wt 212.0 lb

## 2022-11-20 DIAGNOSIS — U071 COVID-19: Secondary | ICD-10-CM | POA: Diagnosis not present

## 2022-11-20 DIAGNOSIS — Z87891 Personal history of nicotine dependence: Secondary | ICD-10-CM | POA: Diagnosis not present

## 2022-11-20 DIAGNOSIS — R059 Cough, unspecified: Secondary | ICD-10-CM | POA: Diagnosis present

## 2022-11-20 DIAGNOSIS — J069 Acute upper respiratory infection, unspecified: Secondary | ICD-10-CM

## 2022-11-20 NOTE — Discharge Instructions (Signed)
You have a viral upper respiratory infection that will run its course and is treated symptomatically as we discussed.  Ensure that you are drinking plenty of water and getting plenty of rest.  COVID test is pending.  Will call if it is positive.

## 2022-11-20 NOTE — ED Triage Notes (Signed)
Patient c/o cough, congestion, headache, dizziness, fever x 2 days.  Patient did vomit after eating mac & cheese.  Feels like the flu.  Patient has taken cough med from a previous visit and Tylenol.

## 2022-11-20 NOTE — ED Provider Notes (Signed)
[facility]    CSN: [id number] Arrival date & time: 11/20/22  1455      History   Chief Complaint Chief Complaint  Patient presents with   Cough    HPI Scott Shannon. is a 57 y.o. male.   Patient presents with cough, nasal congestion, headache, dizziness, fever, nausea, vomiting.  Reports that vomiting typically occurs after eating but he has been able to tolerate food and fluids today.  Patient is not sure temp max at home but states that he felt feverish.  His wife has similar symptoms.  Has taken Tylenol and benzonatate for symptoms with some improvement.  Denies history of asthma or COPD and patient does not currently smoke cigarettes.  Reports that he was a previous cigarette smoker.   Cough   Past Medical History:  Diagnosis Date   Arthritis    BILATERAL   Carcinogenic effect 08/30/2021   Cataract    BILATERAL   Deaf    COCHLEAR IMPLANT REQUIRED   Explosion and rupture of other specified pressurized devices, sequela 08/30/2021   Family history of cancer 08/30/2021   GERD (gastroesophageal reflux disease)    History of hand surgery 08/30/2021   History of left hip replacement 08/30/2021   Hyperlipidemia    Hyponatremia 09/27/2021   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 08/30/2021   Perineum pain, male 08/30/2021   Polyarthralgia 01/20/2022    Patient Active Problem List   Diagnosis Date Noted   Hypertension associated with diabetes (HCC) 09/18/2022   Psoriasis 09/18/2022   History of tobacco use 09/18/2022   Hyperhidrosis 06/05/2022   Controlled type 2 diabetes mellitus with complication, without long-term current use of insulin (HCC) 01/22/2022   Hyperlipidemia associated with type 2 diabetes mellitus (HCC) 08/30/2021   Primary osteoarthritis involving multiple joints 08/30/2021   Primary insomnia 08/30/2021   Snoring 08/30/2021   Sensorineural hearing loss (SNHL) of both ears 02/28/2017    Past Surgical History:  Procedure Laterality Date   HAND  SURGERY Bilateral    HEAD SUTURES     HIP REPLACEMENT Left    URINARY TRACT SURGERY     AS A CHILD       Home Medications    Prior to Admission medications   Medication Sig Start Date End Date Taking? Authorizing Provider  Blood Glucose Monitoring Suppl DEVI 1 each by Does not apply route in the morning, at noon, and at bedtime. May substitute to any manufacturer covered by patient's insurance. 06/04/22  Yes Garnette Gunner, MD  clobetasol (TEMOVATE) 0.05 % external solution Apply topically. 09/06/22  Yes [provider]  meloxicam (MOBIC) 15 MG tablet Po q d prn pain 08/08/22  Yes Cammy Copa, MD  Multiple Vitamin (MULTIVITAMIN ADULT PO) Take by mouth daily. TAKE ONE DAILY   Yes [provider]  olmesartan (BENICAR) 5 MG tablet Take 1 tablet (5 mg total) by mouth daily. 01/16/22 01/11/23 Yes Garnette Gunner, MD  rosuvastatin (CRESTOR) 10 MG tablet Take 1 tablet by mouth once daily 10/30/22  Yes Garnette Gunner, MD  triamcinolone cream (KENALOG) 0.1 % Apply topically. 09/05/22  Yes [provider]    Family History Family History  Problem Relation Age of Onset   Colon polyps Mother    Colon polyps Father    Cancer Father    Colon cancer Neg Hx    Crohn's disease Neg Hx    Esophageal cancer Neg Hx    Rectal cancer Neg Hx  Stomach cancer Neg Hx     Social History Social History   Tobacco Use   Smoking status: Former    Current packs/day: 0.00    Average packs/day: 2.0 packs/day for 30.0 years (60.0 ttl pk-yrs)    Types: Cigarettes    Start date: 31    Quit date: 2022    Years since quitting: 2.5   Smokeless tobacco: Never  Vaping Use   Vaping status: Never Used  Substance Use Topics   Alcohol use: Yes    Comment: occ   Drug use: Never     Allergies   Amoxicillin and Metformin and related   Review of Systems Review of Systems Per HPI  Physical Exam Triage Vital Signs ED Triage Vitals  Encounter Vitals Group     BP  11/20/22 1545 134/87     Systolic BP Percentile --      Diastolic BP Percentile --      Pulse Rate 11/20/22 1545 (!) 59     Resp 11/20/22 1545 18     Temp 11/20/22 1545 98.8 F (37.1 C)     Temp Source 11/20/22 1545 Oral     SpO2 11/20/22 1545 97 %     Weight 11/20/22 1546 212 lb (96.2 kg)     Height 11/20/22 1546 5\' 11"  (1.803 m)     Head Circumference --      Peak Flow --      Pain Score 11/20/22 1546 8     Pain Loc --      Pain Education --      Exclude from Growth Chart --    No data found.  Updated Vital Signs BP 134/87 (BP Location: Left Arm)   Pulse (!) 59   Temp 98.8 F (37.1 C) (Oral)   Resp 18   Ht 5\' 11"  (1.803 m)   Wt 212 lb (96.2 kg)   SpO2 97%   BMI 29.57 kg/m   Visual Acuity Right Eye Distance:   Left Eye Distance:   Bilateral Distance:    Right Eye Near:   Left Eye Near:    Bilateral Near:     Physical Exam Constitutional:      General: He is not in acute distress.    Appearance: Normal appearance. He is not toxic-appearing or diaphoretic.  HENT:     Head: Normocephalic and atraumatic.     Right Ear: Tympanic membrane and ear canal normal.     Left Ear: Tympanic membrane and ear canal normal.     Nose: Congestion present.     Mouth/Throat:     Mouth: Mucous membranes are moist.     Pharynx: No posterior oropharyngeal erythema.  Eyes:     Extraocular Movements: Extraocular movements intact.     Conjunctiva/sclera: Conjunctivae normal.     Pupils: Pupils are equal, round, and reactive to light.  Cardiovascular:     Rate and Rhythm: Normal rate and regular rhythm.     Pulses: Normal pulses.     Heart sounds: Normal heart sounds.  Pulmonary:     Effort: Pulmonary effort is normal. No respiratory distress.     Breath sounds: Normal breath sounds. No stridor. No wheezing, rhonchi or rales.  Abdominal:     General: Abdomen is flat. Bowel sounds are normal.     Palpations: Abdomen is soft.  Musculoskeletal:        General: Normal range of  motion.     Cervical back: Normal range of motion.  Skin:    General: Skin is warm and dry.  Neurological:     General: No focal deficit present.     Mental Status: He is alert and oriented to person, place, and time. Mental status is at baseline.     Cranial Nerves: Cranial nerves 2-12 are intact.     Sensory: Sensation is intact.     Motor: Motor function is intact.     Coordination: Coordination is intact.     Gait: Gait is intact.  Psychiatric:        Mood and Affect: Mood normal.        Behavior: Behavior normal.      UC Treatments / Results  Labs (all labs ordered are listed, but only abnormal results are displayed) Labs Reviewed  SARS CORONAVIRUS 2 (TAT 6-24 HRS)    EKG   Radiology No results found.  Procedures Procedures (including critical care time)  Medications Ordered in UC Medications - No data to display  Initial Impression / Assessment and Plan / UC Course  I have reviewed the triage vital signs and the nursing notes.  Pertinent labs & imaging results that were available during my care of the patient were reviewed by me and considered in my medical decision making (see chart for details).     Patient presents with symptoms likely from a viral upper respiratory infection. Do not suspect underlying cardiopulmonary process. Symptoms seem unlikely related to ACS, CHF or COPD exacerbations, pneumonia, pneumothorax. Patient is nontoxic appearing and not in need of emergent medical intervention.  Flu testing deferred given duration of symptoms as it would not change treatment.  COVID test pending.  Suspect dizziness is related to acute illness as neuroexam is normal so no further workup is necessary for this.  Recommended symptom control with over the counter medications. Advised adequate fluid hydration, rest, bland diet.   Return if symptoms fail to improve in 1-2 weeks or you develop shortness of breath, chest pain, severe headache. Patient states  understanding and is agreeable.  Discharged with PCP followup.  Final Clinical Impressions(s) / UC Diagnoses   Final diagnoses:  Viral upper respiratory tract infection with cough     Discharge Instructions      You have a viral upper respiratory infection that will run its course and is treated symptomatically as we discussed.  Ensure that you are drinking plenty of water and getting plenty of rest.  COVID test is pending.  Will call if it is positive.    ED Prescriptions   None    PDMP not reviewed this encounter.   Gustavus Bryant, [place] 11/20/22 [phone]

## 2022-11-22 ENCOUNTER — Telehealth: Payer: Self-pay | Admitting: Family Medicine

## 2022-11-22 NOTE — Telephone Encounter (Signed)
Patient to reach out to UC or follow up for OV. If symptoms severe, recommend seek care urgently or go to ED.

## 2022-11-22 NOTE — Telephone Encounter (Signed)
11/22/22 - pt called stating he wasn't feeling good and went to urgent care last Friday and he was tested +ve to covid, he is asking if Dr Janee Morn can prescribe him a medication to use, I suggested he book an appt for OV but he declined. Pt wants a call back on [phone].

## 2022-11-23 ENCOUNTER — Telehealth (INDEPENDENT_AMBULATORY_CARE_PROVIDER_SITE_OTHER): Payer: 59 | Admitting: Family Medicine

## 2022-11-23 DIAGNOSIS — J439 Emphysema, unspecified: Secondary | ICD-10-CM

## 2022-11-23 DIAGNOSIS — U071 COVID-19: Secondary | ICD-10-CM

## 2022-11-23 DIAGNOSIS — E118 Type 2 diabetes mellitus with unspecified complications: Secondary | ICD-10-CM | POA: Diagnosis not present

## 2022-11-23 MED ORDER — PROMETHAZINE-DM 6.25-15 MG/5ML PO SYRP
5.0000 mL | ORAL_SOLUTION | Freq: Four times a day (QID) | ORAL | 0 refills | Status: DC | PRN
Start: 2022-11-23 — End: 2023-06-18

## 2022-11-23 MED ORDER — ALBUTEROL SULFATE HFA 108 (90 BASE) MCG/ACT IN AERS
2.0000 | INHALATION_SPRAY | Freq: Four times a day (QID) | RESPIRATORY_TRACT | 2 refills | Status: DC | PRN
Start: 2022-11-23 — End: 2023-06-18

## 2022-11-23 MED ORDER — NIRMATRELVIR/RITONAVIR (PAXLOVID)TABLET
3.0000 | ORAL_TABLET | Freq: Two times a day (BID) | ORAL | 0 refills | Status: AC
Start: 2022-11-23 — End: 2022-11-28

## 2022-11-23 MED ORDER — PREDNISONE 50 MG PO TABS
ORAL_TABLET | ORAL | 0 refills | Status: DC
Start: 2022-11-23 — End: 2022-12-19

## 2022-11-23 NOTE — Assessment & Plan Note (Addendum)
Concern for possible COPD exacerbation 2/2 covid Prescribe Paxlovid for COVID-19. Prescribe prednisone 50 mg daily for 5 days. Continue albuterol inhaler as needed. Prescribe promethazine DM syrup for cough, 5 mL up to 4 times daily. Advise follow-up if symptoms do not improve, recommend urgent/emergency care if worsening.

## 2022-11-23 NOTE — Progress Notes (Signed)
Virtual Visit via Video Note  I connected with Scott Shannon. on 11/23/2022 at 10:00 AM EDT by a video enabled telemedicine application and verified that I am speaking with the correct person using two identifiers.  Location: Patient: home Provider: office   I discussed the limitations of evaluation and management by telemedicine and the availability of in person appointments. The patient expressed understanding and agreed to proceed.  History of Present Illness:  Chief Complaint: Patient reports experiencing chest pain and difficulty breathing, likely secondary to COVID-19 infection.  History of Present Illness:  COVID-19 Infection: Patient presents with complications arising from a COVID-19 infection where tested positive at [facility]. Reports overall improvement but still experiences chest muscle soreness due to coughing that worsens with deep breaths. Denies wheezing but acknowledges a history of emphysema due to smoking. Has not noticed any blood in sputum. Previously, an urgent care visit  on 11/20/2022, revealed no wheezing on examination.   The patient reports experiencing fatigue but denies having fever or chills. no hemoptysis, denies any palpitations . Reports no nausea, vomiting, diarrhea, or constipation.   No other complaints in this system.  Emphysema: Patient has a history of emphysema related to smoking. No current signs of acute exacerbation noted by patient, but ongoing respiratory discomfort persists. Uses an albuterol inhaler intermittently.  Diabetes Mellitus: Patient has a history of well-controlled diabetes but has experienced increased blood sugar levels with past steroid use. Patient does not regularly check blood sugar at home.    Observations/Objective: There were no vitals filed for this visit.   Gen: NAD, resting comfortably HEENT: EOMI Pulm: NWOB Skin: no rash on face Neuro: no facial asymmetry or dysmetria Psych: Normal affect   Assessment and  Plan: Problem List Items Addressed This Visit       Respiratory   Pulmonary emphysema (HCC) - Primary   Relevant Medications   nirmatrelvir/ritonavir (PAXLOVID) 20 x 150 MG & 10 x 100MG  TABS   albuterol (VENTOLIN HFA) 108 (90 Base) MCG/ACT inhaler   predniSONE (DELTASONE) 50 MG tablet   promethazine-dextromethorphan (PROMETHAZINE-DM) 6.25-15 MG/5ML syrup   Other Relevant Orders   Ambulatory Referral for Lung Cancer Screening [[id number]]     Endocrine   Controlled type 2 diabetes mellitus with complication, without long-term current use of insulin (HCC)    Monitor blood glucose levels closely during prednisone therapy. Encourage patient to start daily blood sugar monitoring, especially during the course of steroids. Follow-up to reassess blood sugar control post-steroid treatment.        Other   COVID-19    Concern for possible COPD exacerbation 2/2 covid Prescribe Paxlovid for COVID-19. Prescribe prednisone 50 mg daily for 5 days. Continue albuterol inhaler as needed. Prescribe promethazine DM syrup for cough, 5 mL up to 4 times daily. Advise follow-up if symptoms do not improve, recommend urgent/emergency care if worsening.      Relevant Medications   nirmatrelvir/ritonavir (PAXLOVID) 20 x 150 MG & 10 x 100MG  TABS   albuterol (VENTOLIN HFA) 108 (90 Base) MCG/ACT inhaler   predniSONE (DELTASONE) 50 MG tablet   promethazine-dextromethorphan (PROMETHAZINE-DM) 6.25-15 MG/5ML syrup    There are no discontinued medications.   Follow Up Instructions: No follow-ups on file.    I discussed the assessment and treatment plan with the patient. The patient was provided an opportunity to ask questions and all were answered. The patient agreed with the plan and demonstrated an understanding of the instructions.   The patient was advised to call back or  seek an in-person evaluation if the symptoms worsen or if the condition fails to improve as anticipated.  Garnette Gunner, MD

## 2022-11-23 NOTE — Assessment & Plan Note (Signed)
Monitor blood glucose levels closely during prednisone therapy. Encourage patient to start daily blood sugar monitoring, especially during the course of steroids. Follow-up to reassess blood sugar control post-steroid treatment.

## 2022-11-23 NOTE — Telephone Encounter (Signed)
Pt unable to come in for OV but agreed to a mychart video visit today. Pt scheduled at 10am with PCP.

## 2022-11-30 ENCOUNTER — Encounter (INDEPENDENT_AMBULATORY_CARE_PROVIDER_SITE_OTHER): Payer: Self-pay

## 2022-12-19 ENCOUNTER — Ambulatory Visit (INDEPENDENT_AMBULATORY_CARE_PROVIDER_SITE_OTHER): Payer: 59 | Admitting: Family Medicine

## 2022-12-19 ENCOUNTER — Encounter: Payer: Self-pay | Admitting: Family Medicine

## 2022-12-19 VITALS — BP 136/82 | HR 75 | Temp 97.6°F | Wt 219.0 lb

## 2022-12-19 DIAGNOSIS — I152 Hypertension secondary to endocrine disorders: Secondary | ICD-10-CM | POA: Diagnosis not present

## 2022-12-19 DIAGNOSIS — E1159 Type 2 diabetes mellitus with other circulatory complications: Secondary | ICD-10-CM

## 2022-12-19 DIAGNOSIS — Z7985 Long-term (current) use of injectable non-insulin antidiabetic drugs: Secondary | ICD-10-CM

## 2022-12-19 DIAGNOSIS — E1165 Type 2 diabetes mellitus with hyperglycemia: Secondary | ICD-10-CM

## 2022-12-19 LAB — POCT GLYCOSYLATED HEMOGLOBIN (HGB A1C): Hemoglobin A1C: 7.2 % — AB (ref 4.0–5.6)

## 2022-12-19 MED ORDER — OZEMPIC (0.25 OR 0.5 MG/DOSE) 2 MG/3ML ~~LOC~~ SOPN
0.2500 mg | PEN_INJECTOR | SUBCUTANEOUS | 0 refills | Status: DC
Start: 2022-12-19 — End: 2023-03-09

## 2022-12-19 MED ORDER — OLMESARTAN MEDOXOMIL 5 MG PO TABS
10.0000 mg | ORAL_TABLET | Freq: Every day | ORAL | 3 refills | Status: DC
Start: 2022-12-19 — End: 2023-03-20

## 2022-12-19 NOTE — Assessment & Plan Note (Signed)
Increase olmesartan to 10 mg daily. Encourage home monitoring of blood pressure. Follow up in 3 months to review blood pressure logs.

## 2022-12-19 NOTE — Progress Notes (Signed)
Assessment/Plan:   Problem List Items Addressed This Visit       Cardiovascular and Mediastinum   Hypertension associated with diabetes (HCC)    Increase olmesartan to 10 mg daily. Encourage home monitoring of blood pressure. Follow up in 3 months to review blood pressure logs.      Relevant Medications   Semaglutide,0.25 or 0.5MG /DOS, (OZEMPIC, 0.25 OR 0.5 MG/DOSE,) 2 MG/3ML SOPN   olmesartan (BENICAR) 5 MG tablet     Endocrine   Type 2 diabetes mellitus with hyperglycemia (HCC) - Primary    Plan: Initiate semaglutide 0.25 mg weekly injections. Educate on dietary modifications and consistent monitoring. Follow up in 3 months to recheck A1C levels.      Relevant Medications   Semaglutide,0.25 or 0.5MG /DOS, (OZEMPIC, 0.25 OR 0.5 MG/DOSE,) 2 MG/3ML SOPN   olmesartan (BENICAR) 5 MG tablet   Other Relevant Orders   POCT glycosylated hemoglobin (Hb A1C) (Completed)    Medications Discontinued During This Encounter  Medication Reason   predniSONE (DELTASONE) 50 MG tablet    olmesartan (BENICAR) 5 MG tablet     Return in about 3 months (around 03/20/2023) for BP, DM, HLD.    Subjective:   Encounter date: 12/19/2022  Scott Aronhalt. is a 57 y.o. male who has Sensorineural hearing loss (SNHL) of both ears; Hyperlipidemia associated with type 2 diabetes mellitus (HCC); Primary osteoarthritis involving multiple joints; Primary insomnia; Snoring; Type 2 diabetes mellitus with hyperglycemia (HCC); Hyperhidrosis; Hypertension associated with diabetes (HCC); Psoriasis; History of tobacco use; Pulmonary emphysema (HCC); and COVID-19 on their problem list..   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   Chief Complaint: Follow-up for diabetes management, monitoring of blood pressure, medical management of chronic issues including hypertension, diabetes mellitus, and hyperlipidemia (non-fasting).  History of Present Illness:  Diabetes: The patient, Scott Blome., returns for a follow-up on diabetes and blood pressure. His A1C today is 7.2, slightly above the goal of under 7. The patient reports dietary indiscretion (consumed bread prior to the test) and alcohol intake, which may have contributed to the increased A1C. The patient previously experienced issues with metformin, including gastrointestinal side effects like diarrhea. Semaglutide was discussed as an alternative treatment.  Discussed benefits including cardiac disease prevention, weight loss, and reduced hypoglycemia risk.  Patient agreed trial of this. Hypertension: The patient's blood pressure today is 136/82. This is slightly above the target for diabetics (<130/80). He does not currently monitor his blood pressure at home. The patient is on olmesartan 5 mg daily.   Review of Systems  Constitutional:  Negative for chills, diaphoresis, fever, malaise/fatigue and weight loss.  HENT:  Negative for congestion, ear discharge, ear pain and hearing loss.   Eyes:  Negative for blurred vision, double vision, photophobia, pain, discharge and redness.  Respiratory:  Negative for cough, sputum production, shortness of breath and wheezing.   Cardiovascular:  Negative for chest pain, palpitations, orthopnea, claudication, leg swelling and PND.  Gastrointestinal:  Negative for abdominal pain, blood in stool, constipation, diarrhea, heartburn, melena, nausea and vomiting.  Genitourinary:  Negative for dysuria, flank pain, frequency, hematuria and urgency.  Musculoskeletal:  Negative for myalgias.   Skin:  Negative for itching  and rash.  Neurological:  Negative for dizziness, tingling, tremors, speech change, focal weakness, seizures, loss of consciousness, weakness and headaches.  Endo/Heme/Allergies:  Negative for polydipsia.  Psychiatric/Behavioral:  Negative for depression, hallucinations, memory loss, substance abuse and suicidal ideas. The patient is not nervous/anxious and does not have insomnia.   All other systems reviewed and are negative.   Past Surgical History:  Procedure Laterality Date   HAND SURGERY Bilateral    HEAD SUTURES     HIP REPLACEMENT Left    URINARY TRACT SURGERY     AS A CHILD    Outpatient Medications Prior to Visit  Medication Sig Dispense Refill   albuterol (VENTOLIN HFA) 108 (90 Base) MCG/ACT inhaler Inhale 2 puffs into the lungs every 6 (six) hours as needed for wheezing or shortness of breath. 6.7 g 2   Blood Glucose Monitoring Suppl DEVI 1 each by Does not apply route in the morning, at noon, and at bedtime. May substitute to any manufacturer covered by patient's insurance. 1 each 0   clobetasol (TEMOVATE) 0.05 % external solution Apply topically.     meloxicam (MOBIC) 15 MG tablet Po q d prn pain 30 tablet 2   Multiple Vitamin (MULTIVITAMIN ADULT PO) Take by mouth daily. TAKE ONE DAILY     promethazine-dextromethorphan (PROMETHAZINE-DM) 6.25-15 MG/5ML syrup Take 5 mLs by mouth 4 (four) times daily as needed for cough. 118 mL 0   rosuvastatin (CRESTOR) 10 MG tablet Take 1 tablet by mouth once daily 90 tablet 0   triamcinolone cream (KENALOG) 0.1 % Apply topically.     olmesartan (BENICAR) 5 MG tablet Take 1 tablet (5 mg total) by mouth daily. 90 tablet 3   predniSONE (DELTASONE) 50 MG tablet Take 1 tablet daily for 5 days. 5 tablet 0   No facility-administered medications prior to visit.    Family History  Problem Relation Age of Onset   Colon polyps Mother    Colon polyps Father    Cancer Father    Colon cancer Neg Hx    Crohn's disease  Neg Hx    Esophageal cancer Neg Hx    Rectal cancer Neg Hx    Stomach cancer Neg Hx     Social History   Socioeconomic History   Marital status: Married    Spouse name: Not on file   Number of children: Not on file   Years of education: Not on file   Highest education level: 12th grade  Occupational History   Not on file  Tobacco Use   Smoking status: Former    Current packs/day: 0.00    Average packs/day: 2.0 packs/day for 30.0 years (60.0 ttl pk-yrs)    Types: Cigarettes    Start date: 30    Quit date: 2022    Years since quitting: 2.6   Smokeless tobacco: Never  Vaping Use   Vaping status: Never Used  Substance and Sexual Activity   Alcohol use: Yes    Comment: occ   Drug use: Never   Sexual activity: Yes  Other Topics Concern   Not on file  Social History Narrative   Not on file   Social Determinants of Health   Financial Resource Strain: Low Risk  (11/13/2022)   Overall Financial Resource Strain (CARDIA)    Difficulty of Paying Living Expenses: Not hard at all  Food Insecurity: No Food Insecurity (11/13/2022)   Hunger Vital Sign    Worried About Running Out of Food in the Last Year: Never  true    Ran Out of Food in the Last Year: Never true  Recent Concern: Food Insecurity - Food Insecurity Present (09/01/2022)   Hunger Vital Sign    Worried About Running Out of Food in the Last Year: Sometimes true    Ran Out of Food in the Last Year: Sometimes true  Transportation Needs: No Transportation Needs (11/13/2022)   PRAPARE - Administrator, Civil Service (Medical): No    Lack of Transportation (Non-Medical): No  Physical Activity: Sufficiently Active (11/13/2022)   Exercise Vital Sign    Days of Exercise per Week: 4 days    Minutes of Exercise per Session: 60 min  Recent Concern: Physical Activity - Insufficiently Active (09/01/2022)   Exercise Vital Sign    Days of Exercise per Week: 3 days    Minutes of Exercise per Session: 30 min  Stress: No  Stress Concern Present (11/13/2022)   [employer] of Occupational Health - Occupational Stress Questionnaire    Feeling of Stress : Not at all  Social Connections: Socially Isolated (11/13/2022)   Social Connection and Isolation Panel [NHANES]    Frequency of Communication with Friends and Family: Never    Frequency of Social Gatherings with Friends and Family: Never    Attends Religious Services: Never    Database administrator or Organizations: No    Attends Banker Meetings: Never    Marital Status: Married  Catering manager Violence: Not At Risk (11/13/2022)   Humiliation, Afraid, Rape, and Kick questionnaire    Fear of Current or Ex-Partner: No    Emotionally Abused: No    Physically Abused: No    Sexually Abused: No                                                                                                  Objective:  Physical Exam: BP 136/82 (BP Location: Left Arm, Patient Position: Sitting, Cuff Size: Large)   Pulse 75   Temp 97.6 F (36.4 C) (Temporal)   Wt 219 lb (99.3 kg)   SpO2 99%   BMI 30.54 kg/m     Physical Exam  No results found.  Recent Results (from the past 2160 hour(s))  SARS CORONAVIRUS 2 (TAT 6-24 HRS) Anterior Nasal Swab     Status: Abnormal   Collection Time: 11/20/22  4:04 PM   Specimen: Anterior Nasal Swab  Result Value Ref Range   SARS Coronavirus 2 POSITIVE (A) NEGATIVE    Comment: (NOTE) SARS-CoV-2 target nucleic acids are DETECTED.  The SARS-CoV-2 RNA is generally detectable in upper and lower respiratory specimens during the acute phase of infection. Positive results are indicative of the presence of SARS-CoV-2 RNA. Clinical correlation with patient history and other diagnostic information is  necessary to determine patient infection status. Positive results do not rule out bacterial infection or co-infection with other viruses.  The expected result is Negative.  Fact Sheet for  Patients: HairSlick.no  Fact Sheet for Healthcare Providers: quierodirigir.com  This test is not yet approved or cleared by the [place] FDA and  has  been authorized for detection and/or diagnosis of SARS-CoV-2 by FDA under an Emergency Use Authorization (EUA). This EUA will remain  in effect (meaning this test can be used) for the duration of the COVID-19 declaration under Section 564(b)(1) of the Act, 21 U. S.C. section 360bbb-3(b)(1), unless the authorization is terminated or revoked sooner.   Performed at [facility], [address] [place], [place] [address]   POCT glycosylated hemoglobin (Hb A1C)     Status: Abnormal   Collection Time: 12/19/22  9:24 AM  Result Value Ref Range   Hemoglobin A1C 7.2 (A) 4.0 - 5.6 %   HbA1c POC (<> result, manual entry)     HbA1c, POC (prediabetic range)     HbA1c, POC (controlled diabetic range)          Garner Nash, MD, MS

## 2022-12-19 NOTE — Assessment & Plan Note (Signed)
Plan: Initiate semaglutide 0.25 mg weekly injections. Educate on dietary modifications and consistent monitoring. Follow up in 3 months to recheck A1C levels.

## 2023-01-30 ENCOUNTER — Other Ambulatory Visit: Payer: Self-pay | Admitting: Family Medicine

## 2023-01-30 DIAGNOSIS — E782 Mixed hyperlipidemia: Secondary | ICD-10-CM

## 2023-02-16 ENCOUNTER — Encounter: Payer: Self-pay | Admitting: Emergency Medicine

## 2023-03-09 ENCOUNTER — Other Ambulatory Visit: Payer: Self-pay | Admitting: Family Medicine

## 2023-03-09 DIAGNOSIS — E1165 Type 2 diabetes mellitus with hyperglycemia: Secondary | ICD-10-CM

## 2023-03-20 ENCOUNTER — Ambulatory Visit (INDEPENDENT_AMBULATORY_CARE_PROVIDER_SITE_OTHER): Payer: 59 | Admitting: Family Medicine

## 2023-03-20 ENCOUNTER — Encounter: Payer: Self-pay | Admitting: Family Medicine

## 2023-03-20 VITALS — BP 122/74 | HR 70 | Temp 97.5°F | Wt 221.0 lb

## 2023-03-20 DIAGNOSIS — E6609 Other obesity due to excess calories: Secondary | ICD-10-CM

## 2023-03-20 DIAGNOSIS — E1165 Type 2 diabetes mellitus with hyperglycemia: Secondary | ICD-10-CM

## 2023-03-20 DIAGNOSIS — R0681 Apnea, not elsewhere classified: Secondary | ICD-10-CM

## 2023-03-20 DIAGNOSIS — R972 Elevated prostate specific antigen [PSA]: Secondary | ICD-10-CM

## 2023-03-20 DIAGNOSIS — R350 Frequency of micturition: Secondary | ICD-10-CM

## 2023-03-20 DIAGNOSIS — E66811 Obesity, class 1: Secondary | ICD-10-CM

## 2023-03-20 DIAGNOSIS — K219 Gastro-esophageal reflux disease without esophagitis: Secondary | ICD-10-CM | POA: Insufficient documentation

## 2023-03-20 DIAGNOSIS — E1159 Type 2 diabetes mellitus with other circulatory complications: Secondary | ICD-10-CM

## 2023-03-20 DIAGNOSIS — Z6830 Body mass index (BMI) 30.0-30.9, adult: Secondary | ICD-10-CM

## 2023-03-20 DIAGNOSIS — M150 Primary generalized (osteo)arthritis: Secondary | ICD-10-CM | POA: Diagnosis not present

## 2023-03-20 DIAGNOSIS — E785 Hyperlipidemia, unspecified: Secondary | ICD-10-CM | POA: Diagnosis not present

## 2023-03-20 DIAGNOSIS — I152 Hypertension secondary to endocrine disorders: Secondary | ICD-10-CM

## 2023-03-20 DIAGNOSIS — Z23 Encounter for immunization: Secondary | ICD-10-CM

## 2023-03-20 DIAGNOSIS — F5101 Primary insomnia: Secondary | ICD-10-CM

## 2023-03-20 DIAGNOSIS — E1169 Type 2 diabetes mellitus with other specified complication: Secondary | ICD-10-CM

## 2023-03-20 DIAGNOSIS — Z122 Encounter for screening for malignant neoplasm of respiratory organs: Secondary | ICD-10-CM

## 2023-03-20 DIAGNOSIS — Z7985 Long-term (current) use of injectable non-insulin antidiabetic drugs: Secondary | ICD-10-CM

## 2023-03-20 LAB — MICROALBUMIN / CREATININE URINE RATIO
Creatinine,U: 60.1 mg/dL
Microalb Creat Ratio: 1.2 mg/g (ref 0.0–30.0)
Microalb, Ur: <0.7 mg/dL (ref 0.0–1.9)

## 2023-03-20 LAB — COMPREHENSIVE METABOLIC PANEL
ALT: 29 U/L (ref 0–53)
AST: 18 U/L (ref 0–37)
Albumin: 4.6 g/dL (ref 3.5–5.2)
Alkaline Phosphatase: 90 U/L (ref 39–117)
BUN: 19 mg/dL (ref 6–23)
CO2: 28 meq/L (ref 19–32)
Calcium: 10 mg/dL (ref 8.4–10.5)
Chloride: 100 meq/L (ref 96–112)
Creatinine, Ser: 1.28 mg/dL (ref 0.40–1.50)
GFR: 61.86 mL/min (ref >60.00)
Glucose, Bld: 114 mg/dL — ABNORMAL HIGH (ref 70–99)
Potassium: 4.7 meq/L (ref 3.5–5.1)
Sodium: 136 meq/L (ref 135–145)
Total Bilirubin: 0.4 mg/dL (ref 0.2–1.2)
Total Protein: 7.2 g/dL (ref 6.0–8.3)

## 2023-03-20 LAB — LIPID PANEL
Cholesterol: 181 mg/dL (ref 0–200)
HDL: 54.9 mg/dL (ref >39.00)
LDL Cholesterol: 87 mg/dL (ref 0–99)
NonHDL: 126.31
Total CHOL/HDL Ratio: 3
Triglycerides: 195 mg/dL — ABNORMAL HIGH (ref 0.0–149.0)
VLDL: 39 mg/dL (ref 0.0–40.0)

## 2023-03-20 LAB — POCT GLYCOSYLATED HEMOGLOBIN (HGB A1C): Hemoglobin A1C: 6.5 % — AB (ref 4.0–5.6)

## 2023-03-20 LAB — PSA: PSA: 8.88 ng/mL — ABNORMAL HIGH (ref 0.10–4.00)

## 2023-03-20 MED ORDER — OZEMPIC (0.25 OR 0.5 MG/DOSE) 2 MG/3ML ~~LOC~~ SOPN
0.5000 mg | PEN_INJECTOR | SUBCUTANEOUS | 0 refills | Status: DC
Start: 2023-03-20 — End: 2023-05-01

## 2023-03-20 MED ORDER — OLMESARTAN MEDOXOMIL 5 MG PO TABS: 10.0000 mg | ORAL_TABLET | Freq: Every day | ORAL | 3 refills | Status: DC

## 2023-03-20 MED ORDER — MELOXICAM 15 MG PO TABS
15.0000 mg | ORAL_TABLET | Freq: Every day | ORAL | 3 refills | Status: AC
Start: 2023-03-20 — End: 2024-03-14

## 2023-03-20 MED ORDER — ESOMEPRAZOLE MAGNESIUM 40 MG PO CPDR
DELAYED_RELEASE_CAPSULE | ORAL | 3 refills | Status: DC
Start: 2023-03-20 — End: 2023-08-10

## 2023-03-20 NOTE — Assessment & Plan Note (Signed)
Stable.   Continue olmesartan 10 mg daily. Encourage ongoing home monitoring.

## 2023-03-20 NOTE — Assessment & Plan Note (Signed)
Improved  Plan: Increase semaglutide (Ozempic) to 0.5 mg weekly injections. Continue to monitor dietary habits. Repeat A1C in 3 months.

## 2023-03-20 NOTE — Assessment & Plan Note (Signed)
Experiences nocturnal gasping   Plan: Refer for sleep study to assess for obstructive sleep apnea.

## 2023-03-20 NOTE — Assessment & Plan Note (Signed)
Worsening right shoulder pain; patient declines surgical intervention currently.  Plan: Refill meloxicam 15 mg daily. Refer to physical therapy for shoulder rehabilitation.

## 2023-03-20 NOTE — Assessment & Plan Note (Addendum)
Increased urination noted. UA to r/o infxn PSA test ordered to assess prostate health.

## 2023-03-20 NOTE — Assessment & Plan Note (Signed)
Worsening nightly heartburn leading to nocturnal choking sensations. History of esophageal ulcers.  Plan: Initiate esomeprazole 40 mg daily. Refer to gastroenterology for further evaluation and possible endoscopy.

## 2023-03-20 NOTE — Progress Notes (Signed)
Assessment/Plan:   Problem List Items Addressed This Visit       Cardiovascular and Mediastinum   Hypertension associated with diabetes (HCC)    Stable.   Continue olmesartan 10 mg daily. Encourage ongoing home monitoring.      Relevant Medications   Semaglutide,0.25 or 0.5MG /DOS, (OZEMPIC, 0.25 OR 0.5 MG/DOSE,) 2 MG/3ML SOPN   olmesartan (BENICAR) 5 MG tablet   Other Relevant Orders   Comp Met (CMET)     Digestive   Gastroesophageal reflux disease without esophagitis    Worsening nightly heartburn leading to nocturnal choking sensations. History of esophageal ulcers.  Plan: Initiate esomeprazole 40 mg daily. Refer to gastroenterology for further evaluation and possible endoscopy.      Relevant Medications   esomeprazole (NEXIUM) 40 MG capsule   Other Relevant Orders   Ambulatory referral to Gastroenterology     Endocrine   Hyperlipidemia associated with type 2 diabetes mellitus (HCC)   Relevant Medications   Semaglutide,0.25 or 0.5MG /DOS, (OZEMPIC, 0.25 OR 0.5 MG/DOSE,) 2 MG/3ML SOPN   olmesartan (BENICAR) 5 MG tablet   Other Relevant Orders   Lipid panel   Type 2 diabetes mellitus with hyperglycemia (HCC) - Primary    Improved  Plan: Increase semaglutide (Ozempic) to 0.5 mg weekly injections. Continue to monitor dietary habits. Repeat A1C in 3 months.      Relevant Medications   Semaglutide,0.25 or 0.5MG /DOS, (OZEMPIC, 0.25 OR 0.5 MG/DOSE,) 2 MG/3ML SOPN   olmesartan (BENICAR) 5 MG tablet   Other Relevant Orders   POCT glycosylated hemoglobin (Hb A1C) (Completed)   Urinalysis w microscopic + reflex cultur   Microalbumin / creatinine urine ratio     Musculoskeletal and Integument   Primary osteoarthritis involving multiple joints    Worsening right shoulder pain; patient declines surgical intervention currently.  Plan: Refill meloxicam 15 mg daily. Refer to physical therapy for shoulder rehabilitation.      Relevant Medications   meloxicam  (MOBIC) 15 MG tablet   Other Relevant Orders   Ambulatory referral to Physical Therapy     Other   Primary insomnia   Class 1 obesity due to excess calories with serious comorbidity and body mass index (BMI) of 30.0 to 30.9 in adult   Relevant Medications   Semaglutide,0.25 or 0.5MG /DOS, (OZEMPIC, 0.25 OR 0.5 MG/DOSE,) 2 MG/3ML SOPN   Apnea    Experiences nocturnal gasping   Plan: Refer for sleep study to assess for obstructive sleep apnea.      Relevant Orders   Ambulatory referral to Sleep Studies   Increased frequency of urination    Increased urination noted. UA to r/o infxn PSA test ordered to assess prostate health.      Relevant Orders   PSA   Other Visit Diagnoses     Screening for lung cancer       Relevant Orders   Ambulatory Referral Lung Cancer Screening [facility]   Immunization due       Relevant Orders   Flu vaccine trivalent PF, 6mos and older(Flulaval,Afluria,Fluarix,Fluzone) (Completed)       Medications Discontinued During This Encounter  Medication Reason   meloxicam (MOBIC) 15 MG tablet Reorder   OZEMPIC, 0.25 OR 0.5 MG/DOSE, 2 MG/3ML SOPN    olmesartan (BENICAR) 5 MG tablet     Return in about 3 months (around 06/18/2023) for DM, BP, HLD.    Subjective:   Encounter date: 03/20/2023  Scott Shannon. is a 57 y.o. male who has Sensorineural hearing loss (  SNHL) of both ears; Hyperlipidemia associated with type 2 diabetes mellitus (HCC); Primary osteoarthritis involving multiple joints; Primary insomnia; Snoring; Type 2 diabetes mellitus with hyperglycemia (HCC); Hyperhidrosis; Hypertension associated with diabetes (HCC); Psoriasis; History of tobacco use; Pulmonary emphysema (HCC); COVID-19; Class 1 obesity due to excess calories with serious comorbidity and body mass index (BMI) of 30.0 to 30.9 in adult; Apnea; Gastroesophageal reflux disease without esophagitis; and Increased frequency of urination on their problem list..   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   Chief Complaint: Follow-up for medical management of chronic issues including hypertension, diabetes mellitus, hyperlipidemia (non-fasting), refill of meloxicam, and evaluation of worsening heartburn.  History of Present Illness:  Diabetes: Scott Shannon returns for follow-up on diabetes management. His A1C today is 6.5, showing significant improvement from the previous 7.2. He reports good tolerance of semaglutide 0.25 mg weekly injections (Ozempic) with no adverse effects.   Hypertension: Blood pressure today is 122/74, indicating good control. Scott Shannon has responded well to the increased dose of olmesartan 10 mg daily.  Osteoarthritis: Scott Shannon reports worsening shoulder pain, particularly in the right shoulder. He finds relief with meloxicam 15 mg daily but is reluctant to pursue surgical options at this time. He has not tried physical therapy previously.   GERD: Scott Shannon reports worsening heartburn occurring nightly, causing him to wake up feeling like he's choking. He needs to sit up in bed to prevent vomiting. Over-the-counter acid reducers have provided minimal relief, and he frequently uses Tums. He has a history of esophageal ulcers identified during a past endoscopy.   Sleep Apnea Concerns: Scott Shannon experiences nocturnal gasping but does not have a history of snoring, according to his wife.   Lung Cancer Screening: Scott Shannon mentioned not receiving updates on his lung cancer screening. The order will be placed again to facilitate  scheduling.  Review of Systems:  Constitutional: No fever, no chills. Eyes: No changes in vision noted. ENT: No sore throat; history of esophageal ulcers. Cardiovascular: No chest pain. Blood pressure well-controlled. Respiratory: No shortness of breath currently. Improved post-COVID recovery. Gastrointestinal: Reports worsening heartburn occurring nightly; wakes up choking. No current diarrhea. Genitourinary: Increased urination; drinks large amounts of fluids. Musculoskeletal: Worsening shoulder pain; no new muscle aches. Skin: No new rashes. Neurological: No complaints. Psychiatric: No complaints.  Past Surgical History:  Procedure Laterality Date   HAND SURGERY Bilateral    HEAD SUTURES     HIP REPLACEMENT Left    URINARY TRACT SURGERY     AS A CHILD    Outpatient Medications Prior to Visit  Medication Sig Dispense Refill   Blood Glucose Monitoring Suppl DEVI 1 each by Does not apply route in the morning, at noon, and at bedtime. May substitute to any manufacturer covered by patient's insurance. 1 each 0   Multiple Vitamin (MULTIVITAMIN ADULT PO) Take by mouth daily. TAKE ONE DAILY     rosuvastatin (CRESTOR) 10 MG tablet Take 1 tablet by mouth once daily 90 tablet 0   meloxicam (MOBIC) 15 MG tablet Po q d prn pain 30 tablet 2   olmesartan (BENICAR) 5 MG tablet Take 2 tablets (10 mg total) by mouth daily. 180 tablet 3   OZEMPIC, 0.25 OR 0.5  MG/DOSE, 2 MG/3ML SOPN INJECT 0.25 MG INTO THE SKIN ONCE A WEEK 9 mL 0   albuterol (VENTOLIN HFA) 108 (90 Base) MCG/ACT inhaler Inhale 2 puffs into the lungs every 6 (six) hours as needed for wheezing or shortness of breath. (Patient not taking: Reported on 03/20/2023) 6.7 g 2   clobetasol (TEMOVATE) 0.05 % external solution Apply topically. (Patient not taking: Reported on 03/20/2023)     promethazine-dextromethorphan (PROMETHAZINE-DM) 6.25-15 MG/5ML syrup Take 5 mLs by mouth 4 (four) times daily as needed for cough. (Patient not taking:  Reported on 03/20/2023) 118 mL 0   triamcinolone cream (KENALOG) 0.1 % Apply topically. (Patient not taking: Reported on 03/20/2023)     No facility-administered medications prior to visit.    Family History  Problem Relation Age of Onset   Colon polyps Mother    Colon polyps Father    Cancer Father    Colon cancer Neg Hx    Crohn's disease Neg Hx    Esophageal cancer Neg Hx    Rectal cancer Neg Hx    Stomach cancer Neg Hx     Social History   Socioeconomic History   Marital status: Married    Spouse name: Not on file   Number of children: Not on file   Years of education: Not on file   Highest education level: 12th grade  Occupational History   Not on file  Tobacco Use   Smoking status: Former    Current packs/day: 0.00    Average packs/day: 2.0 packs/day for 30.0 years (60.0 ttl pk-yrs)    Types: Cigarettes    Start date: 83    Quit date: 2022    Years since quitting: 2.9   Smokeless tobacco: Never  Vaping Use   Vaping status: Never Used  Substance and Sexual Activity   Alcohol use: Yes    Comment: occ   Drug use: Never   Sexual activity: Yes  Other Topics Concern   Not on file  Social History Narrative   Not on file   Social Determinants of Health   Financial Resource Strain: Low Risk  (03/19/2023)   Overall Financial Resource Strain (CARDIA)    Difficulty of Paying Living Expenses: Not hard at all  Food Insecurity: No Food Insecurity (03/19/2023)   Hunger Vital Sign    Worried About Running Out of Food in the Last Year: Never true    Ran Out of Food in the Last Year: Never true  Transportation Needs: No Transportation Needs (03/19/2023)   PRAPARE - Administrator, Civil Service (Medical): No    Lack of Transportation (Non-Medical): No  Physical Activity: Insufficiently Active (03/19/2023)   Exercise Vital Sign    Days of Exercise per Week: 3 days    Minutes of Exercise per Session: 20 min  Stress: No Stress Concern Present (03/19/2023)    [employer] of Occupational Health - Occupational Stress Questionnaire    Feeling of Stress : Only a little  Social Connections: Unknown (03/19/2023)   Social Connection and Isolation Panel [NHANES]    Frequency of Communication with Friends and Family: Never    Frequency of Social Gatherings with Friends and Family: Never    Attends Religious Services: Patient declined    Database administrator or Organizations: No    Attends Banker Meetings: Never    Marital Status: Married  Catering manager Violence: Not At Risk (11/13/2022)   Humiliation, Afraid, Rape, and Kick questionnaire  Fear of Current or Ex-Partner: No    Emotionally Abused: No    Physically Abused: No    Sexually Abused: No                                                                                                  Objective:  Physical Exam: BP 122/74 (BP Location: Left Arm, Patient Position: Sitting, Cuff Size: Large)   Pulse 70   Temp (!) 97.5 F (36.4 C) (Temporal)   Wt 221 lb (100.2 kg)   SpO2 98%   BMI 30.82 kg/m   Wt Readings from Last 3 Encounters:  03/20/23 221 lb (100.2 kg)  12/19/22 219 lb (99.3 kg)  11/20/22 212 lb (96.2 kg)     Physical Exam Constitutional:      Appearance: Normal appearance.  HENT:     Head: Normocephalic and atraumatic.     Right Ear: Hearing normal.     Left Ear: Hearing normal.     Nose: Nose normal.  Eyes:     General: No scleral icterus.       Right eye: No discharge.        Left eye: No discharge.     Extraocular Movements: Extraocular movements intact.  Cardiovascular:     Rate and Rhythm: Normal rate and regular rhythm.     Heart sounds: Normal heart sounds.  Pulmonary:     Effort: Pulmonary effort is normal.     Breath sounds: Normal breath sounds.  Abdominal:     Palpations: Abdomen is soft.     Tenderness: There is no abdominal tenderness.  Skin:    General: Skin is warm.     Findings: No rash.  Neurological:     General: No  focal deficit present.     Mental Status: He is alert.     Cranial Nerves: No cranial nerve deficit.  Psychiatric:        Mood and Affect: Mood normal.        Behavior: Behavior normal.        Thought Content: Thought content normal.        Judgment: Judgment normal.     No results found.  Recent Results (from the past 2160 hour(s))  POCT glycosylated hemoglobin (Hb A1C)     Status: Abnormal   Collection Time: 03/20/23  8:34 AM  Result Value Ref Range   Hemoglobin A1C 6.5 (A) 4.0 - 5.6 %   HbA1c POC (<> result, manual entry)     HbA1c, POC (prediabetic range)     HbA1c, POC (controlled diabetic range)          Garner Nash, MD, MS

## 2023-03-20 NOTE — Patient Instructions (Addendum)
-   Take olmesartan (Benicar) 10?mg once daily as prescribed to manage your blood pressure. - Begin esomeprazole (Nexium) 40?mg once daily an hour before breakfast to help with heartburn. - Continue semaglutide (Ozempic) injections, increasing the dose to 0.5?mg once weekly for diabetes management. - Follow up with the gastroenterologist for further evaluation of your heartburn symptoms. - Schedule a sleep study to assess for possible sleep apnea as discussed. - We will try to reschedule your lung cancer screening

## 2023-03-21 LAB — URINALYSIS W MICROSCOPIC + REFLEX CULTURE
Bacteria, UA: NONE SEEN /[HPF]
Bilirubin Urine: NEGATIVE
Glucose, UA: NEGATIVE
Hgb urine dipstick: NEGATIVE
Hyaline Cast: NONE SEEN /[LPF]
Ketones, ur: NEGATIVE
Leukocyte Esterase: NEGATIVE
Nitrites, Initial: NEGATIVE
Protein, ur: NEGATIVE
RBC / HPF: NONE SEEN /[HPF] (ref 0–2)
Specific Gravity, Urine: 1.012 (ref 1.001–1.035)
Squamous Epithelial / HPF: NONE SEEN /[HPF] (ref <=5)
WBC, UA: NONE SEEN /[HPF] (ref 0–5)
pH: 6 (ref 5.0–8.0)

## 2023-03-21 LAB — NO CULTURE INDICATED

## 2023-03-23 NOTE — Addendum Note (Signed)
Addended by: Fanny Bien B on: 03/23/2023 12:55 PM   Modules accepted: Orders

## 2023-04-24 ENCOUNTER — Ambulatory Visit (INDEPENDENT_AMBULATORY_CARE_PROVIDER_SITE_OTHER): Payer: 59 | Admitting: Neurology

## 2023-04-24 ENCOUNTER — Encounter: Payer: Self-pay | Admitting: Neurology

## 2023-04-24 VITALS — BP 127/99 | HR 73 | Ht 71.0 in | Wt 227.2 lb

## 2023-04-24 DIAGNOSIS — K219 Gastro-esophageal reflux disease without esophagitis: Secondary | ICD-10-CM | POA: Insufficient documentation

## 2023-04-24 DIAGNOSIS — E669 Obesity, unspecified: Secondary | ICD-10-CM | POA: Insufficient documentation

## 2023-04-24 DIAGNOSIS — R0683 Snoring: Secondary | ICD-10-CM | POA: Insufficient documentation

## 2023-04-24 DIAGNOSIS — R0681 Apnea, not elsewhere classified: Secondary | ICD-10-CM | POA: Diagnosis not present

## 2023-04-24 DIAGNOSIS — Z7985 Long-term (current) use of injectable non-insulin antidiabetic drugs: Secondary | ICD-10-CM

## 2023-04-24 DIAGNOSIS — E1151 Type 2 diabetes mellitus with diabetic peripheral angiopathy without gangrene: Secondary | ICD-10-CM | POA: Diagnosis not present

## 2023-04-24 NOTE — Patient Instructions (Signed)
 Screening for Sleep Apnea by HST   Sleep apnea is a condition in which breathing pauses or becomes shallow during sleep. Sleep apnea screening is a test to determine if you are at risk for sleep apnea. The test includes a series of questions. It will only takes a few minutes. Your health care provider may ask you to have this test in preparation for surgery or as part of a physical exam. What are the symptoms of sleep apnea? Common symptoms of sleep apnea include: Snoring. Waking up often at night. Daytime sleepiness. Pauses in breathing. Choking or gasping during sleep. Irritability. Forgetfulness. Trouble thinking clearly. Depression. Personality changes. Most people with sleep apnea do not know that they have it. What are the advantages of sleep apnea screening? Getting screened for sleep apnea can help: Ensure your safety. It is important for your health care providers to know whether or not you have sleep apnea, especially if you are having surgery or have other long-term (chronic) health conditions. Improve your health and allow you to get a better night's rest. Restful sleep can help you: Have more energy. Lose weight. Improve high blood pressure. Improve diabetes management. Prevent stroke. Prevent car accidents. What happens during the screening? Screening usually includes being asked a list of questions about your sleep quality. Some questions you may be asked include: Do you snore? Is your sleep restless? Do you have daytime sleepiness? Has a partner or spouse told you that you stop breathing during sleep? Have you had trouble concentrating or memory loss? What is your age? What is your neck circumference? To measure your neck, keep your back straight and gently wrap the tape measure around your neck. Put the tape measure at the middle of your neck, between your chin and collarbone. What is your sex assigned at birth? Do you have or are you being treated for high blood  pressure? If your screening test is positive, you are at risk for the condition. Further testing may be needed to confirm a diagnosis of sleep apnea. Where to find more information You can find screening tools online or at your health care clinic. For more information about sleep apnea screening and healthy sleep, visit these websites: Centers for Disease Control and Prevention: footballexhibition.com.br American Sleep Apnea Association: www.sleepapnea.org Contact a health care provider if: You think that you may have sleep apnea. Summary Sleep apnea screening can help determine if you are at risk for sleep apnea. It is important for your health care providers to know whether or not you have sleep apnea, especially if you are having surgery or have other chronic health conditions. You may be asked to take a screening test for sleep apnea in preparation for surgery or as part of a physical exam. This information is not intended to replace advice given to you by your health care provider. Make sure you discuss any questions you have with your health care provider. Document Revised: 03/12/2020 Document Reviewed: 03/12/2020 Elsevier Patient Education  2024 [employer].

## 2023-04-24 NOTE — Progress Notes (Addendum)
 SLEEP [facility]    Provider:  Dedra Gores, MD  Primary Care Physician:  Sebastian Beverley NOVAK, MD [address] [place] [address]     Referring Provider: Sebastian Beverley NOVAK, Md [address] [place],  [place] [address]          Chief Complaint according to patient   Patient presents with:     New Patient (Initial Visit) sleep consult by Beverley Sebastian, MD . Patient started 3 weeks ago on Nexium  and since then has not had sleep choking attacks.            HISTORY OF PRESENT ILLNESS:  Scott Jesson. is a 58 y.o. male patient who is seen upon referral on 04/24/2023 from DR Sebastian  for a sleep Consultation.  Chief concern according to patient :  I woke up from choking sensation, attributed apnea to GERD, treated GERd and since then no longer happening. I am not sleepy, I sleep 4-6 hours each night. I have always slept  about this long , I feel fine with this.    I have the pleasure of seeing Scott Shannon. on 04/24/23 a right-handed male with a possible sleep disorder.      Sleep relevant medical history: Nocturia 1-2 times, used to be 3 times until 2 moth ago, on Ozempic   for DM, on nexium , GERD- has been dysarthric, cochlear implant right side, nasal speech. Born hearing impaired.     Family medical /sleep history:  no other family member on CPAP with OSA,  Social history: Patient is working as a civil service fast streamer, and lives in a household with spouse / male partner - The patient  used to work in shifts( chief technology officer,) as a naval architect, [employer].   Tobacco use:  quit Thanksgiving 2022.   ETOH use : 1 drink with dinner ( 6-8 PM ) one mixed drink..  Caffeine intake in form of Coffee( none) Soda( 1 a day) Tea ( quit a moths ago ) or energy drinks Exercise in form of  walking,   hiking.       Sleep habits are as follows: The patient's dinner time is between 6-8 PM. The patient goes to bed at  10 -11 PM and continues to sleep for 4-6 hours,  wakes for one bathroom breaks, rises  at 6  AM.   The preferred sleep position is variable , with the support of 1-2 pillows, slight incline-  Dreams are reportedly frequent but not vivid.   The patient wakes up spontaneously at 6 AM without an alarm. 6  AM is the usual rise time. He/ reports  feeling refreshed or restored in AM, with symptoms such as dry mouth, no morning headaches, and residual fatigue. He needs a humidifier.  Naps are taken infrequently, no need, no desire.   Review of Systems: Out of a complete 14 system review, the patient complains of only the following symptoms, and all other reviewed systems are negative.:  Fatigue, sleepiness , snoring, fragmented sleep, Insomnia, RLS, Nocturia has been reduced on Ozempic ,  snoring reduced on weight loss-  GERD is improved on Nexium .    How likely are you to doze in the following situations: 0 = not likely, 1 = slight chance, 2 = moderate chance, 3 = high chance   Sitting and Reading? Watching Television? Sitting inactive in a public place (theater or meeting)? As a passenger in a car for an hour without a break?  Lying down in the afternoon when circumstances permit? Sitting and talking to someone? Sitting quietly after lunch without alcohol? In a car, while stopped for a few minutes in traffic?   Total = 1/ 24 points   FSS endorsed at 28/ 63 points.   Social History   Socioeconomic History   Marital status: Married    Spouse name: Not on file   Number of children: Not on file   Years of education: Not on file   Highest education level: 12th grade  Occupational History   Not on file  Tobacco Use   Smoking status: Former    Current packs/day: 0.00    Average packs/day: 2.0 packs/day for 30.0 years (60.0 ttl pk-yrs)    Types: Cigarettes    Start date: 41    Quit date: 2022    Years since quitting: 3.0   Smokeless tobacco: Never  Vaping Use   Vaping status: Never Used  Substance and Sexual Activity   Alcohol  use: Yes    Comment: occ   Drug use: Never   Sexual activity: Yes  Other Topics Concern   Not on file  Social History Narrative   Not on file   Social Drivers of Health   Financial Resource Strain: Low Risk  (03/19/2023)   Overall Financial Resource Strain (CARDIA)    Difficulty of Paying Living Expenses: Not hard at all  Food Insecurity: No Food Insecurity (03/19/2023)   Hunger Vital Sign    Worried About Running Out of Food in the Last Year: Never true    Ran Out of Food in the Last Year: Never true  Transportation Needs: No Transportation Needs (03/19/2023)   PRAPARE - Administrator, Civil Service (Medical): No    Lack of Transportation (Non-Medical): No  Physical Activity: Insufficiently Active (03/19/2023)   Exercise Vital Sign    Days of Exercise per Week: 3 days    Minutes of Exercise per Session: 20 min  Stress: No Stress Concern Present (03/19/2023)   [employer] of Occupational Health - Occupational Stress Questionnaire    Feeling of Stress : Only a little  Social Connections: Unknown (03/19/2023)   Social Connection and Isolation Panel [NHANES]    Frequency of Communication with Friends and Family: Never    Frequency of Social Gatherings with Friends and Family: Never    Attends Religious Services: Patient declined    Database Administrator or Organizations: No    Attends Engineer, Structural: Never    Marital Status: Married    Family History  Problem Relation Age of Onset   Colon polyps Mother    Colon polyps Father    Cancer Father    Colon cancer Neg Hx    Crohn's disease Neg Hx    Esophageal cancer Neg Hx    Rectal cancer Neg Hx    Stomach cancer Neg Hx     Past Medical History:  Diagnosis Date   Arthritis    BILATERAL   Carcinogenic effect 08/30/2021   Cataract    BILATERAL   Deaf    COCHLEAR IMPLANT REQUIRED   Explosion and rupture of other specified pressurized devices, sequela 08/30/2021   Family history of cancer  08/30/2021   GERD (gastroesophageal reflux disease)    History of hand surgery 08/30/2021   History of left hip replacement 08/30/2021   Hyperlipidemia    Hyponatremia 09/27/2021   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 08/30/2021   Perineum pain, male 08/30/2021  Polyarthralgia 01/20/2022    Past Surgical History:  Procedure Laterality Date   HAND SURGERY Bilateral    HEAD SUTURES     HIP REPLACEMENT Left    URINARY TRACT SURGERY     AS A CHILD     Current Outpatient Medications on File Prior to Visit  Medication Sig Dispense Refill   esomeprazole  (NEXIUM ) 40 MG capsule Take 1 capsule 1 hour before breakfast in the morning 30 capsule 3   meloxicam  (MOBIC ) 15 MG tablet Take 1 tablet (15 mg total) by mouth daily. 90 tablet 3   Multiple Vitamin (MULTIVITAMIN ADULT PO) Take by mouth daily. TAKE ONE DAILY     olmesartan  (BENICAR ) 5 MG tablet Take 2 tablets (10 mg total) by mouth daily. 180 tablet 3   rosuvastatin  (CRESTOR ) 10 MG tablet Take 1 tablet by mouth once daily 90 tablet 0   Semaglutide ,0.25 or 0.5MG /DOS, (OZEMPIC , 0.25 OR 0.5 MG/DOSE,) 2 MG/3ML SOPN Inject 0.5 mg into the skin once a week. 9 mL 0   albuterol  (VENTOLIN  HFA) 108 (90 Base) MCG/ACT inhaler Inhale 2 puffs into the lungs every 6 (six) hours as needed for wheezing or shortness of breath. (Patient not taking: Reported on 04/24/2023) 6.7 g 2   Blood Glucose Monitoring Suppl DEVI 1 each by Does not apply route in the morning, at noon, and at bedtime. May substitute to any manufacturer covered by patient's insurance. (Patient not taking: Reported on 04/24/2023) 1 each 0   clobetasol (TEMOVATE) 0.05 % external solution Apply topically. (Patient not taking: Reported on 04/24/2023)     promethazine -dextromethorphan (PROMETHAZINE -DM) 6.25-15 MG/5ML syrup Take 5 mLs by mouth 4 (four) times daily as needed for cough. (Patient not taking: Reported on 04/24/2023) 118 mL 0   triamcinolone cream (KENALOG) 0.1 % Apply topically. (Patient not taking: Reported  on 04/24/2023)     No current facility-administered medications on file prior to visit.    Allergies  Allergen Reactions   Amoxicillin Nausea Only   Metformin  And Related Diarrhea     DIAGNOSTIC DATA (LABS, IMAGING, TESTING) - I reviewed patient records, labs, notes, testing and imaging myself where available.  Lab Results  Component Value Date   WBC 7.4 06/02/2022   HGB 14.1 06/02/2022   HCT 41.4 06/02/2022   MCV 92.3 06/02/2022   PLT 252.0 06/02/2022      Component Value Date/Time   NA 136 03/20/2023 0904   K 4.7 03/20/2023 0904   CL 100 03/20/2023 0904   CO2 28 03/20/2023 0904   GLUCOSE 114 (H) 03/20/2023 0904   BUN 19 03/20/2023 0904   CREATININE 1.28 03/20/2023 0904   CALCIUM  10.0 03/20/2023 0904   PROT 7.2 03/20/2023 0904   ALBUMIN 4.6 03/20/2023 0904   AST 18 03/20/2023 0904   ALT 29 03/20/2023 0904   ALKPHOS 90 03/20/2023 0904   BILITOT 0.4 03/20/2023 0904   Lab Results  Component Value Date   CHOL 181 03/20/2023   HDL 54.90 03/20/2023   LDLCALC 87 03/20/2023   TRIG 195.0 (H) 03/20/2023   CHOLHDL 3 03/20/2023   Lab Results  Component Value Date   HGBA1C 6.5 (A) 03/20/2023   No results found for: VITAMINB12 Lab Results  Component Value Date   TSH 1.68 06/02/2022    PHYSICAL EXAM:  Today's Vitals   04/24/23 1307  BP: (!) 127/99  Pulse: 73  Weight: 227 lb 3.2 oz (103.1 kg)  Height: 5' 11 (1.803 m)   Body mass index is 31.69 kg/m.  Wt Readings from Last 3 Encounters:  04/24/23 227 lb 3.2 oz (103.1 kg)  03/20/23 221 lb (100.2 kg)  12/19/22 219 lb (99.3 kg)     Ht Readings from Last 3 Encounters:  04/24/23 5' 11 (1.803 m)  11/20/22 5' 11 (1.803 m)  06/02/22 5' 11 (1.803 m)      General: The patient is awake, alert and appears not in acute distress. The patient is well groomed. Head: Normocephalic, atraumatic. Neck is supple.  Mallampati  3 plus, large tongue.  neck circumference:19 inches . Nasal airflow partially  patent.   Retrognathia is not  seen.  Dental status: none biological - ful dentures. Lost all teeth by age 58,  Cardiovascular:  Regular rate and cardiac rhythm by pulse,  without distended neck veins. Respiratory: Lungs are clear to auscultation.  Skin:  Without evidence of ankle edema, or rash. Trunk: The patient's posture is erect.   NEUROLOGIC EXAM: The patient is awake and alert, oriented to place and time.   Memory subjective described as intact.  Attention span & concentration ability appears normal.  Speech is fluent,  without  dysarthria, dysphonia or aphasia.  Mood and affect are appropriate.   Cranial nerves: no loss of smell or taste reported  Pupils are equal and briskly reactive to light. Funduscopic exam deferred. .  Extraocular movements in vertical and horizontal planes were intact and without nystagmus. No Diplopia. Visual fields by finger perimetry are intact. Hearing was impaired- .   Facial sensation intact to fine touch.  Facial motor strength is symmetric and tongue and uvula move midline.  Neck ROM : rotation, tilt and flexion extension were normal for age and shoulder shrug was asymmetrical.  The left shoulder sits lower.    Motor exam:  Symmetric bulk, tone and ROM.  has had left hip replaced, left shoulder ROM is impaired .  Normal tone without cog - wheeling, symmetric grip strength . He had fractures in both wrists.    Sensory:  Fine touch and vibration were normal.  Proprioception tested in the upper extremities was normal.   Coordination: Rapid alternating movements in the fingers/hands were of normal speed.  The Finger-to-nose maneuver was intact without evidence of ataxia, dysmetria or tremor.   Gait and station: Patient could rise unassisted from a seated position, walked without assistive device.  Stance is of normal width/ base and the patient turned with 3 steps.  Toe and heel walk were deferred.  Deep tendon reflexes: in the upper and lower  extremities are symmetric and intact.  Babinski response was deferred .    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 He carries a dx of emphysema.     1) reportedly in the recent past, he had episodic choking in sleep, attributed to GERD and improved on Nexium .  Sleeps slightly elevated too. He has oesophagitis and ulcers in the food pipe.   2) loud snoring, associated with supine sleep.  He is edentulous and the dentures support his facial structures during the day , but not at night.  Fits Mallampati , large neck, BMI - and has rhinitis , nasal speech.   3) no EDS, no fatigue,    I will be happy to screen for OSA.   I plan to follow up Prn after a positive HST - either personally or through our NP within 3-5 months.   I would like to thank Sebastian,  Beverley NOVAK, MD and Sebastian Beverley NOVAK, Md [address] [place],  [place] [address] for allowing me to meet with and to take care of this pleasant patient.     After spending a total time of  35  minutes face to face and additional time for physical and neurologic examination, review of laboratory studies,  personal review of imaging studies, reports and results of other testing and review of referral information / records as far as provided in visit,   Electronically signed by: Dedra Gores, MD 04/24/2023 1:18 PM  [facility] Board certified by The [employer] of Sleep Medicine and Diplomate of the [employer] of Sleep Medicine. Board certified In Neurology through the ABPN, Fellow of the [employer] of Neu[employer].

## 2023-05-01 ENCOUNTER — Ambulatory Visit (INDEPENDENT_AMBULATORY_CARE_PROVIDER_SITE_OTHER): Payer: 59 | Admitting: Family Medicine

## 2023-05-01 VITALS — BP 118/78 | HR 80 | Temp 97.4°F | Wt 217.0 lb

## 2023-05-01 DIAGNOSIS — E1165 Type 2 diabetes mellitus with hyperglycemia: Secondary | ICD-10-CM | POA: Diagnosis not present

## 2023-05-01 DIAGNOSIS — I4510 Unspecified right bundle-branch block: Secondary | ICD-10-CM | POA: Insufficient documentation

## 2023-05-01 DIAGNOSIS — Z7984 Long term (current) use of oral hypoglycemic drugs: Secondary | ICD-10-CM

## 2023-05-01 DIAGNOSIS — R42 Dizziness and giddiness: Secondary | ICD-10-CM | POA: Insufficient documentation

## 2023-05-01 MED ORDER — MECLIZINE HCL 25 MG PO TABS: 12.5000 mg | ORAL_TABLET | Freq: Three times a day (TID) | ORAL | 0 refills | Status: DC | PRN

## 2023-05-01 MED ORDER — OZEMPIC (0.25 OR 0.5 MG/DOSE) 2 MG/3ML ~~LOC~~ SOPN: 0.2500 mg | PEN_INJECTOR | SUBCUTANEOUS | 0 refills | Status: DC

## 2023-05-01 NOTE — Progress Notes (Signed)
 Assessment/Plan:   Problem List Items Addressed This Visit       Endocrine   Type 2 diabetes mellitus with hyperglycemia (HCC)   Relevant Medications   Semaglutide ,0.25 or 0.5MG /DOS, (OZEMPIC , 0.25 OR 0.5 MG/DOSE,) 2 MG/3ML SOPN     Other   Incomplete right bundle branch block (RBBB) determined by electrocardiography   Dizziness - Primary   Patient presents with episodes of vertigo accompanied by night sweats, chills, vomiting, and shaking since Friday. Symptoms began after consuming approximately two shots of alcohol with dinner. There is a potential association with the recent increase in Ozempic  dosage or initiation of esomeprazole . Orthostatic vitals are normal. EKG w/ SR and incomplete RBBB. Unchanged since last ECG.   Differential Diagnosis: Medication-induced vertigo (Ozempic  side effect) Benign paroxysmal positional vertigo  Vestibular neuritis Cardiac arrhythmia Orthostatic hypotension - Less likely w/ normal vitals Hypoglycemia secondary to Ozempic   Plan: Reduce Ozempic  dosage to 0.25 mg weekly due to potential side effects. Maintain esomeprazole  40 mg daily for GERD. Prescribe meclizine  12.5-25 mg three times daily as needed for vertigo symptoms. Obtain orthostatic vital signs. Refer to cardiology to rule out cardiac causes. Consider referral to ENT or neurology for vestibular assessment. Advise the patient to monitor blood glucose levels closely after medication adjustment. Instruct the patient to seek emergency care if symptoms recur, especially with chest pain or shortness of breath.      Relevant Medications   meclizine  (ANTIVERT ) 25 MG tablet   Other Relevant Orders   EKG 12-Lead (Completed)   Ambulatory referral to Cardiology    Medications Discontinued During This Encounter  Medication Reason   Semaglutide ,0.25 or 0.5MG /DOS, (OZEMPIC , 0.25 OR 0.5 MG/DOSE,) 2 MG/3ML SOPN     Return if symptoms worsen or fail to improve, for dizziness.     Subjective:   Encounter date: 05/01/2023  Scott Shannon. is a 58 y.o. male who has Sensorineural hearing loss (SNHL) of both ears; Hyperlipidemia associated with type 2 diabetes mellitus (HCC); Primary osteoarthritis involving multiple joints; Primary insomnia; Snoring; Type 2 diabetes mellitus with hyperglycemia (HCC); Hyperhidrosis; Hypertension associated with diabetes (HCC); Psoriasis; History of tobacco use; Pulmonary emphysema (HCC); COVID-19; Class 1 obesity due to excess calories with serious comorbidity and body mass index (BMI) of 30.0 to 30.9 in adult; Apnea; Gastroesophageal reflux disease without esophagitis; Increased frequency of urination; Loud snoring; GERD with apnea; Obesity (BMI 35.0-39.9 without comorbidity); DM (diabetes mellitus) type II, controlled, with peripheral vascular disorder (HCC); Incomplete right bundle branch block (RBBB) determined by electrocardiography; and Dizziness on their problem list..   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 Pt stopped ozempic  Saturday and esomeprazole  due to symptoms. Not sure if its from medications. ) .   HPI:   Review of Systems  Constitutional:  Positive for diaphoresis.  Gastrointestinal:  Positive for nausea.  Neurological:  Positive for dizziness and headaches.    Past Surgical History:  Procedure Laterality Date   HAND  SURGERY Bilateral    HEAD SUTURES     HIP REPLACEMENT Left    URINARY  TRACT SURGERY     AS A CHILD    Outpatient Medications Prior to Visit  Medication Sig Dispense Refill   esomeprazole  (NEXIUM ) 40 MG capsule Take 1 capsule 1 hour before breakfast in the morning 30 capsule 3   meloxicam  (MOBIC ) 15 MG tablet Take 1 tablet (15 mg total) by mouth daily. 90 tablet 3   Multiple Vitamin (MULTIVITAMIN ADULT PO) Take by mouth daily. TAKE ONE DAILY     olmesartan  (BENICAR ) 5 MG tablet Take 2 tablets (10 mg total) by mouth daily. 180 tablet 3   promethazine -dextromethorphan (PROMETHAZINE -DM) 6.25-15 MG/5ML syrup Take 5 mLs by mouth 4 (four) times daily as needed for cough. 118 mL 0   rosuvastatin  (CRESTOR ) 10 MG tablet Take 1 tablet by mouth once daily 90 tablet 0   triamcinolone cream (KENALOG) 0.1 % Apply topically.     Semaglutide ,0.25 or 0.5MG /DOS, (OZEMPIC , 0.25 OR 0.5 MG/DOSE,) 2 MG/3ML SOPN Inject 0.5 mg into the skin once a week. 9 mL 0   albuterol  (VENTOLIN  HFA) 108 (90 Base) MCG/ACT inhaler Inhale 2 puffs into the lungs every 6 (six) hours as needed for wheezing or shortness of breath. (Patient not taking: Reported on 03/20/2023) 6.7 g 2   Blood Glucose Monitoring Suppl DEVI 1 each by Does not apply route in the morning, at noon, and at bedtime. May substitute to any manufacturer covered by patient's insurance. (Patient not taking: Reported on 05/01/2023) 1 each 0   clobetasol (TEMOVATE) 0.05 % external solution Apply topically. (Patient not taking: Reported on 03/20/2023)     No facility-administered medications prior to visit.    Family History  Problem Relation Age of Onset   Colon polyps Mother    Colon polyps Father    Cancer Father    Colon cancer Neg Hx    Crohn's disease Neg Hx    Esophageal cancer Neg Hx    Rectal cancer Neg Hx    Stomach cancer Neg Hx     Social History   Socioeconomic History   Marital status: Married    Spouse name: Not on file   Number of children:  Not on file   Years of education: Not on file   Highest education level: 12th grade  Occupational History   Not on file  Tobacco Use   Smoking status: Former    Current packs/day: 0.00    Average packs/day: 2.0 packs/day for 30.0 years (60.0 ttl pk-yrs)    Types: Cigarettes    Start date: 26    Quit date: 2022    Years since quitting: 3.0   Smokeless tobacco: Never  Vaping Use   Vaping status: Never Used  Substance and Sexual Activity   Alcohol use: Yes    Comment: occ   Drug use: Never   Sexual activity: Yes  Other Topics Concern   Not on file  Social History Narrative   Not on file   Social Drivers of Health   Financial Resource Strain: Low Risk  (04/30/2023)   Overall Financial Resource Strain (CARDIA)    Difficulty of Paying Living Expenses: Not hard at all  Food Insecurity: No Food Insecurity (04/30/2023)   Hunger Vital Sign    Worried About Running Out of Food in the Last Year: Never true    Ran Out of Food in the Last Year: Never true  Transportation Needs: No Transportation Needs (04/30/2023)   PRAPARE - Administrator, Civil Service (Medical): No    Lack of Transportation (  Non-Medical): No  Physical Activity: Insufficiently Active (04/30/2023)   Exercise Vital Sign    Days of Exercise per Week: 4 days    Minutes of Exercise per Session: 20 min  Stress: No Stress Concern Present (04/30/2023)   [employer] of Occupational Health - Occupational Stress Questionnaire    Feeling of Stress : Not at all  Social Connections: Socially Isolated (04/30/2023)   Social Connection and Isolation Panel [NHANES]    Frequency of Communication with Friends and Family: Never    Frequency of Social Gatherings with Friends and Family: Never    Attends Religious Services: Never    Database Administrator or Organizations: No    Attends Banker Meetings: Never    Marital Status: Married  Catering Manager Violence: Not At Risk (11/13/2022)   Humiliation,  Afraid, Rape, and Kick questionnaire    Fear of Current or Ex-Partner: No    Emotionally Abused: No    Physically Abused: No    Sexually Abused: No                                                                                                  Objective:  Physical Exam: BP 118/78 (BP Location: Left Arm, Patient Position: Lying left side)   Pulse 80   Temp (!) 97.4 F (36.3 C) (Temporal)   Wt 217 lb (98.4 kg)   SpO2 97%   BMI 30.27 kg/m   BP Readings from Last 3 Encounters:  05/01/23 118/78  04/24/23 (!) 127/99  03/20/23 122/74     Physical Exam Constitutional:      Appearance: Normal appearance.  HENT:     Head: Normocephalic and atraumatic.     Right Ear: Hearing normal.     Left Ear: Hearing normal.     Nose: Nose normal.  Eyes:     General: Lids are normal. Vision grossly intact. Gaze aligned appropriately. No scleral icterus.       Right eye: No discharge.        Left eye: No discharge.     Extraocular Movements: Extraocular movements intact.     Pupils: Pupils are equal, round, and reactive to light.     Visual Fields: Right eye visual fields normal and left eye visual fields normal.  Cardiovascular:     Rate and Rhythm: Normal rate and regular rhythm.     Heart sounds: Normal heart sounds.  Pulmonary:     Effort: Pulmonary effort is normal.     Breath sounds: Normal breath sounds.  Abdominal:     Palpations: Abdomen is soft.     Tenderness: There is no abdominal tenderness.  Skin:    General: Skin is warm.     Findings: No rash.  Neurological:     General: No focal deficit present.     Mental Status: He is alert and oriented to person, place, and time.     Cranial Nerves: Cranial nerves 2-12 are intact.     Sensory: Sensation is intact.     Motor: Motor function is intact.     Coordination: Coordination  is intact.     Gait: Gait is intact.  Psychiatric:        Mood and Affect: Mood normal.        Behavior: Behavior normal.        Thought  Content: Thought content normal.        Judgment: Judgment normal.    Orthostatic VS for the past 72 hrs (Last 3 readings):  Patient Position BP Location  05/01/23 1536 Lying left side Left Arm  05/01/23 1535 Standing Left Arm  05/01/23 1534 Sitting Left Arm   ECG: Sinsu Rhythm w/ incomplete RBBB. No change from last ECG in 2023   No results found.  .  Recent Results (from the past 2160 hours)  POCT glycosylated hemoglobin (Hb A1C)     Status: Abnormal   Collection Time: 03/20/23  8:34 AM  Result Value Ref Range   Hemoglobin A1C 6.5 (A) 4.0 - 5.6 %   HbA1c POC (<> result, manual entry)     HbA1c, POC (prediabetic range)     HbA1c, POC (controlled diabetic range)    Urinalysis w microscopic + reflex cultur     Status: None   Collection Time: 03/20/23  9:04 AM   Specimen: Urine  Result Value Ref Range   Color, Urine YELLOW YELLOW   APPearance CLEAR CLEAR   Specific Gravity, Urine 1.012 1.001 - 1.035   pH 6.0 5.0 - 8.0   Glucose, UA NEGATIVE NEGATIVE   Bilirubin Urine NEGATIVE NEGATIVE   Ketones, ur NEGATIVE NEGATIVE   Hgb urine dipstick NEGATIVE NEGATIVE   Protein, ur NEGATIVE NEGATIVE   Nitrites, Initial NEGATIVE NEGATIVE   Leukocyte Esterase NEGATIVE NEGATIVE   WBC, UA NONE SEEN 0 - 5 /HPF   RBC / HPF NONE SEEN 0 - 2 /HPF   Squamous Epithelial / HPF NONE SEEN < OR = 5 /HPF   Bacteria, UA NONE SEEN NONE SEEN /HPF   Hyaline Cast NONE SEEN NONE SEEN /LPF   Note      Comment: This urine was analyzed for the presence of WBC,  RBC, bacteria, casts, and other formed elements.  Only those elements seen were reported. SABRA .   Microalbumin / creatinine urine ratio     Status: None   Collection Time: 03/20/23  9:04 AM  Result Value Ref Range   Microalb, Ur <0.7 0.0 - 1.9 mg/dL   Creatinine,U 39.8 mg/dL   Microalb Creat Ratio 1.2 0.0 - 30.0 mg/g  Comp Met (CMET)     Status: Abnormal   Collection Time: 03/20/23  9:04 AM  Result Value Ref Range   Sodium 136 135 - 145  mEq/L   Potassium 4.7 3.5 - 5.1 mEq/L   Chloride 100 96 - 112 mEq/L   CO2 28 19 - 32 mEq/L   Glucose, Bld 114 (H) 70 - 99 mg/dL   BUN 19 6 - 23 mg/dL   Creatinine, Ser 8.71 0.40 - 1.50 mg/dL   Total Bilirubin 0.4 0.2 - 1.2 mg/dL   Alkaline Phosphatase 90 39 - 117 U/L   AST 18 0 - 37 U/L   ALT 29 0 - 53 U/L   Total Protein 7.2 6.0 - 8.3 g/dL   Albumin 4.6 3.5 - 5.2 g/dL   GFR 38.13 >39.99 mL/min    Comment: Calculated using the CKD-EPI Creatinine Equation (2021)   Calcium  10.0 8.4 - 10.5 mg/dL  PSA     Status: Abnormal   Collection Time: 03/20/23  9:04 AM  Result Value Ref Range   PSA 8.88 (H) 0.10 - 4.00 ng/mL    Comment: Test performed using Access Hybritech PSA Assay, a parmagnetic partical, chemiluminecent immunoassay.  REFLEXIVE URINE CULTURE     Status: None   Collection Time: 03/20/23  9:04 AM  Result Value Ref Range   Reflexve Urine Culture      Comment: NO CULTURE INDICATED  Lipid panel     Status: Abnormal   Collection Time: 03/20/23  9:05 AM  Result Value Ref Range   Cholesterol 181 0 - 200 mg/dL    Comment: ATP III Classification       Desirable:  < 200 mg/dL               Borderline High:  200 - 239 mg/dL          High:  > = 759 mg/dL   Triglycerides 804.9 (H) 0.0 - 149.0 mg/dL    Comment: Normal:  <849 mg/dLBorderline High:  150 - 199 mg/dL   HDL 45.09 >60.99 mg/dL   VLDL 60.9 0.0 - 59.9 mg/dL   LDL Cholesterol 87 0 - 99 mg/dL   Total CHOL/HDL Ratio 3     Comment:                Men          Women1/2 Average Risk     3.4          3.3Average Risk          5.0          4.42X Average Risk          9.6          7.13X Average Risk          15.0          11.0                       NonHDL 126.31     Comment: NOTE:  Non-HDL goal should be 30 mg/dL higher than patient's LDL goal (i.e. LDL goal of < 70 mg/dL, would have non-HDL goal of < 100 mg/dL)        Beverley Adine Hummer, MD, MS

## 2023-05-01 NOTE — Assessment & Plan Note (Signed)
 Patient presents with episodes of vertigo accompanied by night sweats, chills, vomiting, and shaking since Friday. Symptoms began after consuming approximately two shots of alcohol with dinner. There is a potential association with the recent increase in Ozempic  dosage or initiation of esomeprazole . Orthostatic vitals are normal. EKG w/ SR and incomplete RBBB. Unchanged since last ECG.   Differential Diagnosis: Medication-induced vertigo (Ozempic  side effect) Benign paroxysmal positional vertigo  Vestibular neuritis Cardiac arrhythmia Orthostatic hypotension - Less likely w/ normal vitals Hypoglycemia secondary to Ozempic   Plan: Reduce Ozempic  dosage to 0.25 mg weekly due to potential side effects. Maintain esomeprazole  40 mg daily for GERD. Prescribe meclizine  12.5-25 mg three times daily as needed for vertigo symptoms. Obtain orthostatic vital signs. Refer to cardiology to rule out cardiac causes. Consider referral to ENT or neurology for vestibular assessment. Advise the patient to monitor blood glucose levels closely after medication adjustment. Instruct the patient to seek emergency care if symptoms recur, especially with chest pain or shortness of breath.

## 2023-05-01 NOTE — Patient Instructions (Signed)
 Reduce your Ozempic  injection to 0.25?mg weekly as discussed. Continue taking Omeprazole 40?mg daily for your GERD. Monitor your blood sugar levels closely after decreasing your Ozempic  dose. Take Meclizine  12.5-25?mg three times daily as needed for dizziness. We are referring to cardiology for heart assessment.  Seek immediate medical attention if you experience chest pain, shortness of breath, or recurrent dizziness.

## 2023-05-02 ENCOUNTER — Other Ambulatory Visit: Payer: Self-pay | Admitting: Family Medicine

## 2023-05-02 DIAGNOSIS — E782 Mixed hyperlipidemia: Secondary | ICD-10-CM

## 2023-05-07 ENCOUNTER — Ambulatory Visit: Payer: Self-pay | Admitting: Family Medicine

## 2023-05-07 NOTE — Telephone Encounter (Signed)
I called and notified patient of message and he wants to wait and see Dr. Janee Morn tommorow. He said that he feels fine and last notified blood in stool on Saturday.

## 2023-05-07 NOTE — Telephone Encounter (Signed)
   Chief Complaint: bloody stool Symptoms: blood in toilet/on tissue Frequency: most BMS  Disposition: [] ED /[] Urgent Care (no appt availability in office) / [x] Appointment(In office/virtual)/ []  [facility]/ [] Home Care/ [] Refused Recommended Disposition /[] [facility]/ []  Follow-up with PCP Additional Notes: Pt complaining of blood in stool. Pt stated he has had 4-5 BM's where he has noticed blood in stool. Today's BM did not have blood on tissue/in toilet. This has been going on around 10 days. Pt stated when wiping off the amount of blood is "the size of a silver dollar and toilet bowl has blood in it." Pt thinks he has seen small clots. Pt states he has vertigo and is on medication for it, but says that was previous to this. Pt denies all other symptoms. Per protocol, pt to be seen within 24  hours. Pt has appt on 1/21 at 920 with Dr Janee Morn. RN gave care advice and pt verbalized understanding.            Copied from CRM [id number]. Topic: Clinical - Red Word Triage >> May 07, 2023 10:43 AM Corin V wrote: [facility] that prompted transfer to Nurse Triage: Patient returned missed call from clinic RN Almira Coaster had wanted him to speak to Nurse Triage due to appointment tomorrow being for possible blood in the stool. Reason for Disposition  MODERATE rectal bleeding (small blood clots, passing blood without stool, or toilet water turns red)  Answer Assessment - Initial Assessment Questions 1. APPEARANCE of BLOOD: "What color is it?" "Is it passed separately, on the surface of the stool, or mixed in with the stool?"      Can't tell is blood is mixed in , but stool was dark 2. AMOUNT: "How much blood was passed?"      Silver dollar size on tissue, more blood in toilet  3. FREQUENCY: "How many times has blood been passed with the stools?"      5 times within 10 days. 4. ONSET: "When was the blood first seen in the stools?" (Days or weeks)      10 days 5. DIARRHEA: "Is  there also some diarrhea?" If Yes, ask: "How many diarrhea stools in the past 24 hours?"      denies 6. CONSTIPATION: "Do you have constipation?" If Yes, ask: "How bad is it?"     "Little bit" 7. RECURRENT SYMPTOMS: "Have you had blood in your stools before?" If Yes, ask: "When was the last time?" and "What happened that time?"      no 8. BLOOD THINNERS: "Do you take any blood thinners?" (e.g., Coumadin/warfarin, Pradaxa/dabigatran, aspirin)     denies 9. OTHER SYMPTOMS: "Do you have any other symptoms?"  (e.g., abdomen pain, vomiting, dizziness, fever)     Vertigo- on medication  Protocols used: Rectal Bleeding-A-AH

## 2023-05-08 ENCOUNTER — Ambulatory Visit: Payer: 59 | Admitting: Family Medicine

## 2023-05-08 ENCOUNTER — Encounter: Payer: Self-pay | Admitting: Family Medicine

## 2023-05-08 ENCOUNTER — Ambulatory Visit (INDEPENDENT_AMBULATORY_CARE_PROVIDER_SITE_OTHER): Payer: 59 | Admitting: Family Medicine

## 2023-05-08 VITALS — BP 112/60 | HR 60 | Temp 97.7°F | Ht 71.0 in | Wt 220.6 lb

## 2023-05-08 DIAGNOSIS — K921 Melena: Secondary | ICD-10-CM | POA: Insufficient documentation

## 2023-05-08 DIAGNOSIS — K5790 Diverticulosis of intestine, part unspecified, without perforation or abscess without bleeding: Secondary | ICD-10-CM | POA: Insufficient documentation

## 2023-05-08 DIAGNOSIS — K5900 Constipation, unspecified: Secondary | ICD-10-CM | POA: Diagnosis not present

## 2023-05-08 LAB — CBC WITH DIFFERENTIAL/PLATELET
Basophils Absolute: 0.1 10*3/uL (ref 0.0–0.1)
Basophils Relative: 0.8 % (ref 0.0–3.0)
Eosinophils Absolute: 0.2 10*3/uL (ref 0.0–0.7)
Eosinophils Relative: 2.6 % (ref 0.0–5.0)
HCT: 42.5 % (ref 39.0–52.0)
Hemoglobin: 14.2 g/dL (ref 13.0–17.0)
Lymphocytes Relative: 26.8 % (ref 12.0–46.0)
Lymphs Abs: 2.1 10*3/uL (ref 0.7–4.0)
MCHC: 33.3 g/dL (ref 30.0–36.0)
MCV: 93.8 fL (ref 78.0–100.0)
Monocytes Absolute: 0.6 10*3/uL (ref 0.1–1.0)
Monocytes Relative: 7.7 % (ref 3.0–12.0)
Neutro Abs: 4.8 10*3/uL (ref 1.4–7.7)
Neutrophils Relative %: 62.1 % (ref 43.0–77.0)
Platelets: 265 10*3/uL (ref 150.0–400.0)
RBC: 4.53 Mil/uL (ref 4.22–5.81)
RDW: 13.1 % (ref 11.5–15.5)
WBC: 7.7 10*3/uL (ref 4.0–10.5)

## 2023-05-08 LAB — BASIC METABOLIC PANEL
BUN: 19 mg/dL (ref 6–23)
CO2: 28 meq/L (ref 19–32)
Calcium: 9.9 mg/dL (ref 8.4–10.5)
Chloride: 103 meq/L (ref 96–112)
Creatinine, Ser: 1.21 mg/dL (ref 0.40–1.50)
GFR: 66.11 mL/min (ref >60.00)
Glucose, Bld: 108 mg/dL — ABNORMAL HIGH (ref 70–99)
Potassium: 4.2 meq/L (ref 3.5–5.1)
Sodium: 138 meq/L (ref 135–145)

## 2023-05-08 MED ORDER — POLYETHYLENE GLYCOL 3350 17 GM/SCOOP PO POWD: 17.0000 g | Freq: Every day | ORAL | 0 refills | Status: DC

## 2023-05-08 NOTE — Patient Instructions (Addendum)
We are checking your blood counts and electrolytes. We are referring you to gastroenterology. For constipation, increase fruits and vegetables and fibrous components of your diet and drink lots of fluids.  You may take MiraLAX as needed to assist with constipation. For symptoms of dizziness, fatigue, rectal bleeding, diarrhea or other worrisome symptoms please follow-up in the office or                                                                                                                                                                                                                                                                                                                                 if severe go to the emergency department

## 2023-05-08 NOTE — Assessment & Plan Note (Addendum)
Patient presents with recurrent rectal bleeding, most recently on 05/05/2023, characterized by blood in the stool and toilet bowl. There is a history of diverticulosis diagnosed during colonoscopy in 2023. Reports intermittant dizziness. No other associated GI or infectious symptoms.   Differential Diagnosis:  Diverticular Bleeding: Most likely due to known diverticulosis. Hemorrhoids: Possibly internal. No external observed on exam.  Anal Fissure: Unlikely due to absence of pain during defecation.  Plan: Order CBC to assess for anemia. BMP to assess electrolytes and hydration status.  Refer to gastroenterology for further evaluation and management. Discuss signs of significant bleeding and instruct on when to seek urgent care. Trial Miralax for constipation Encourage increased dietary fiber and fluid intake  Return to clinic after lab results are available. Seek immediate medical attention if severe rectal bleeding, dizziness, or syncope occurs.

## 2023-05-08 NOTE — Progress Notes (Addendum)
Assessment/Plan:   Problem List Items Addressed This Visit       Digestive   Diverticulosis   Relevant Orders   Ambulatory referral to Gastroenterology   Basic Metabolic Panel (BMET)     Other   Hematochezia - Primary   Patient presents with recurrent rectal bleeding, most recently on 05/05/2023, characterized by blood in the stool and toilet bowl. There is a history of diverticulosis diagnosed during colonoscopy in 2023. Reports intermittant dizziness. No other associated GI or infectious symptoms.   Differential Diagnosis:  Diverticular Bleeding: Most likely due to known diverticulosis. Hemorrhoids: Possibly internal. No external observed on exam.  Anal Fissure: Unlikely due to absence of pain during defecation.  Plan: Order CBC to assess for anemia. BMP to assess electrolytes and hydration status.  Refer to gastroenterology for further evaluation and management. Discuss signs of significant bleeding and instruct on when to seek urgent care. Trial Miralax for constipation Encourage increased dietary fiber and fluid intake  Return to clinic after lab results are available. Seek immediate medical attention if severe rectal bleeding, dizziness, or syncope occurs.      Relevant Orders   CBC w/Diff   Ambulatory referral to Gastroenterology   Basic Metabolic Panel (BMET)   IFOBT POC (occult bld, rslt in office)   Constipation   Relevant Medications   polyethylene glycol powder (GLYCOLAX/MIRALAX) 17 GM/SCOOP powder   Other Relevant Orders   Ambulatory referral to Gastroenterology   Basic Metabolic Panel (BMET)    There are no discontinued medications.  Return if symptoms worsen or fail to improve, for Blood in stool.    Subjective:   Encounter date: 05/08/2023  Scott Shannon. is a 58 y.o. male who has Sensorineural hearing loss (SNHL) of both ears; Hyperlipidemia associated with type 2 diabetes mellitus (HCC); Primary osteoarthritis involving multiple joints;  Primary insomnia; Snoring; Type 2 diabetes mellitus with hyperglycemia (HCC); Hyperhidrosis; Hypertension associated with diabetes (HCC); Psoriasis; History of tobacco use; Pulmonary emphysema (HCC); COVID-19; Class 1 obesity due to excess calories with serious comorbidity and body mass index (BMI) of 30.0 to 30.9 in adult; Apnea; Gastroesophageal reflux disease without esophagitis; Increased frequency of urination; Loud snoring; GERD with apnea; Obesity (BMI 35.0-39.9 without comorbidity); DM (diabetes mellitus) type II, controlled, with peripheral vascular disorder (HCC); Incomplete right bundle branch block (RBBB) determined by electrocardiography; Dizziness; Diverticulosis; Hematochezia; and Constipation on their problem list..   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   Chief Complaint: Rectal bleeding.  History of Present Illness:  Patient reports episodes of rectal bleeding, with the most recent occurrence on 05/05/2023. Describes bright red blood in the stool and toilet bowl. Has a history of similar episodes. Denies abdominal pain, nausea, vomiting, diarrhea, fevers, or chills. Reports rectal discomfort and itching but no significant pain. Lifelong history of constipation requiring straining during bowel movements. Denies use of laxatives or dietary interventions to manage constipation. Experienced dizziness approximately one week ago, which has since resolved. Heartburn symptoms have improved since starting current medication. Recent adjustment in  semaglutide dosage with reported improvement in dizziness.  Denies chest pain, shortness of breath, fatigue, or other systemic symptoms.   Review of Systems  Constitutional:  Negative  for chills, diaphoresis, fever, malaise/fatigue and weight loss.  HENT:  Negative for congestion, ear discharge, ear pain and hearing loss.   Eyes:  Negative for blurred vision, double vision, photophobia, pain, discharge and redness.  Respiratory:  Negative for cough, sputum production, shortness of breath and wheezing.   Cardiovascular:  Negative for chest pain and palpitations.  Gastrointestinal:  Positive for blood in stool. Negative for abdominal pain, constipation, diarrhea, heartburn, melena, nausea and vomiting.  Genitourinary:  Negative for dysuria, flank pain, frequency, hematuria and urgency.  Musculoskeletal:  Negative for myalgias.  Skin:  Negative for itching and rash.  Neurological:  Positive for dizziness. Negative for tingling, tremors, speech change, seizures, loss of consciousness, weakness and headaches.  Endo/Heme/Allergies:  Negative for polydipsia.  Psychiatric/Behavioral:  Negative for depression, hallucinations, memory loss, substance abuse and suicidal ideas. The patient does not have insomnia.   All other systems reviewed and are negative.   Past Surgical History:  Procedure Laterality Date   HAND SURGERY Bilateral    HEAD SUTURES     HIP REPLACEMENT Left    URINARY TRACT SURGERY     AS A CHILD    Outpatient Medications Prior to Visit  Medication Sig Dispense Refill   esomeprazole (NEXIUM) 40 MG capsule Take 1 capsule 1 hour before breakfast in the morning 30 capsule 3   meclizine (ANTIVERT) 25 MG tablet Take 0.5-1 tablets (12.5-25 mg total) by mouth 3 (three) times daily as needed for dizziness or nausea. 30 tablet 0   meloxicam (MOBIC) 15 MG tablet Take 1 tablet (15 mg total) by mouth daily. 90 tablet 3   Multiple Vitamin (MULTIVITAMIN ADULT PO) Take by mouth daily. TAKE  ONE DAILY     olmesartan (BENICAR) 5 MG tablet Take 2 tablets (10 mg total) by mouth daily. 180 tablet 3   rosuvastatin (CRESTOR) 10 MG tablet Take 1 tablet by mouth once daily 90 tablet 0   Semaglutide,0.25 or 0.5MG /DOS, (OZEMPIC, 0.25 OR 0.5 MG/DOSE,) 2 MG/3ML SOPN Inject 0.25 mg into the skin once a week. 9 mL 0   triamcinolone cream (KENALOG) 0.1 % Apply topically.     albuterol (VENTOLIN HFA) 108 (90 Base) MCG/ACT inhaler Inhale 2 puffs into the lungs every 6 (six) hours as needed for wheezing or shortness of breath. (Patient not taking: Reported on 05/08/2023) 6.7 g 2   Blood Glucose Monitoring Suppl DEVI 1 each by Does not apply route in the morning, at noon, and at bedtime. May substitute to any manufacturer covered by patient's insurance. (Patient not taking: Reported on 04/24/2023) 1 each 0   clobetasol (TEMOVATE) 0.05 % external solution Apply topically. (Patient not taking: Reported on 05/08/2023)     promethazine-dextromethorphan (PROMETHAZINE-DM) 6.25-15 MG/5ML syrup Take 5 mLs by mouth 4 (four) times daily as needed for cough. (Patient not taking: Reported on 05/08/2023) 118 mL 0   No facility-administered medications prior to visit.    Family History  Problem Relation Age of Onset   Colon polyps Mother    Colon polyps Father    Cancer Father    Colon cancer Neg Hx    Crohn's disease Neg Hx    Esophageal cancer Neg Hx    Rectal cancer Neg Hx    Stomach cancer Neg Hx     Social History   Socioeconomic History   Marital status: Married    Spouse name: Not on file   Number of children: Not on file   Years of education: Not on file  Highest education level: 12th grade  Occupational History   Not on file  Tobacco Use   Smoking status: Former    Current packs/day: 0.00    Average packs/day: 2.0 packs/day for 30.0 years (60.0 ttl pk-yrs)    Types: Cigarettes    Start date: 46    Quit date: 2022    Years since quitting: 3.0   Smokeless tobacco: Never  Vaping Use    Vaping status: Never Used  Substance and Sexual Activity   Alcohol use: Yes    Comment: occ   Drug use: Never   Sexual activity: Yes  Other Topics Concern   Not on file  Social History Narrative   Not on file   Social Drivers of Health   Financial Resource Strain: Low Risk  (04/30/2023)   Overall Financial Resource Strain (CARDIA)    Difficulty of Paying Living Expenses: Not hard at all  Food Insecurity: No Food Insecurity (04/30/2023)   Hunger Vital Sign    Worried About Running Out of Food in the Last Year: Never true    Ran Out of Food in the Last Year: Never true  Transportation Needs: No Transportation Needs (04/30/2023)   PRAPARE - Administrator, Civil Service (Medical): No    Lack of Transportation (Non-Medical): No  Physical Activity: Insufficiently Active (04/30/2023)   Exercise Vital Sign    Days of Exercise per Week: 4 days    Minutes of Exercise per Session: 20 min  Stress: No Stress Concern Present (04/30/2023)   [employer] of Occupational Health - Occupational Stress Questionnaire    Feeling of Stress : Not at all  Social Connections: Socially Isolated (04/30/2023)   Social Connection and Isolation Panel [NHANES]    Frequency of Communication with Friends and Family: Never    Frequency of Social Gatherings with Friends and Family: Never    Attends Religious Services: Never    Database administrator or Organizations: No    Attends Banker Meetings: Never    Marital Status: Married  Catering manager Violence: Not At Risk (11/13/2022)   Humiliation, Afraid, Rape, and Kick questionnaire    Fear of Current or Ex-Partner: No    Emotionally Abused: No    Physically Abused: No    Sexually Abused: No                                                                                                  Objective:  Physical Exam: BP 112/60 (BP Location: Left Arm, Patient Position: Sitting, Cuff Size: Normal)   Pulse 60   Temp 97.7 F (36.5  C) (Oral)   Ht 5\' 11"  (1.803 m)   Wt 220 lb 9.6 oz (100.1 kg)   SpO2 95%   BMI 30.77 kg/m   Wt Readings from Last 3 Encounters:  05/08/23 220 lb 9.6 oz (100.1 kg)  05/01/23 217 lb (98.4 kg)  04/24/23 227 lb 3.2 oz (103.1 kg)     Physical Exam Exam conducted with a chaperone present Malena Peer).  Constitutional:      Appearance: Normal  appearance.  HENT:     Head: Normocephalic and atraumatic.     Right Ear: Hearing normal.     Left Ear: Hearing normal.     Nose: Nose normal.  Eyes:     General: No scleral icterus.       Right eye: No discharge.        Left eye: No discharge.     Extraocular Movements: Extraocular movements intact.  Cardiovascular:     Rate and Rhythm: Normal rate and regular rhythm.     Heart sounds: Normal heart sounds.  Pulmonary:     Effort: Pulmonary effort is normal.     Breath sounds: Normal breath sounds.  Abdominal:     Palpations: Abdomen is soft.     Tenderness: There is no abdominal tenderness.  Genitourinary:    Rectum: Guaiac result negative. No mass, anal fissure or external hemorrhoid. Normal anal tone.  Skin:    General: Skin is warm.     Findings: No rash.  Neurological:     General: No focal deficit present.     Mental Status: He is alert.     Cranial Nerves: No cranial nerve deficit.  Psychiatric:        Mood and Affect: Mood normal.        Behavior: Behavior normal.        Thought Content: Thought content normal.        Judgment: Judgment normal.     No results found.  Recent Results (from the past 2160 hours)  POCT glycosylated hemoglobin (Hb A1C)     Status: Abnormal   Collection Time: 03/20/23  8:34 AM  Result Value Ref Range   Hemoglobin A1C 6.5 (A) 4.0 - 5.6 %   HbA1c POC (<> result, manual entry)     HbA1c, POC (prediabetic range)     HbA1c, POC (controlled diabetic range)    Urinalysis w microscopic + reflex cultur     Status: None   Collection Time: 03/20/23  9:04 AM   Specimen: Urine  Result  Value Ref Range   Color, Urine YELLOW YELLOW   APPearance CLEAR CLEAR   Specific Gravity, Urine 1.012 1.001 - 1.035   pH 6.0 5.0 - 8.0   Glucose, UA NEGATIVE NEGATIVE   Bilirubin Urine NEGATIVE NEGATIVE   Ketones, ur NEGATIVE NEGATIVE   Hgb urine dipstick NEGATIVE NEGATIVE   Protein, ur NEGATIVE NEGATIVE   Nitrites, Initial NEGATIVE NEGATIVE   Leukocyte Esterase NEGATIVE NEGATIVE   WBC, UA NONE SEEN 0 - 5 /HPF   RBC / HPF NONE SEEN 0 - 2 /HPF   Squamous Epithelial / HPF NONE SEEN < OR = 5 /HPF   Bacteria, UA NONE SEEN NONE SEEN /HPF   Hyaline Cast NONE SEEN NONE SEEN /LPF   Note      Comment: This urine was analyzed for the presence of WBC,  RBC, bacteria, casts, and other formed elements.  Only those elements seen were reported. Marland Kitchen Donnella Bi / creatinine urine ratio     Status: None   Collection Time: 03/20/23  9:04 AM  Result Value Ref Range   Microalb, Ur <0.7 0.0 - 1.9 mg/dL   Creatinine,U 16.1 mg/dL   Microalb Creat Ratio 1.2 0.0 - 30.0 mg/g  Comp Met (CMET)     Status: Abnormal   Collection Time: 03/20/23  9:04 AM  Result Value Ref Range   Sodium 136 135 - 145 mEq/L   Potassium 4.7 3.5 -  5.1 mEq/L   Chloride 100 96 - 112 mEq/L   CO2 28 19 - 32 mEq/L   Glucose, Bld 114 (H) 70 - 99 mg/dL   BUN 19 6 - 23 mg/dL   Creatinine, Ser 4.09 0.40 - 1.50 mg/dL   Total Bilirubin 0.4 0.2 - 1.2 mg/dL   Alkaline Phosphatase 90 39 - 117 U/L   AST 18 0 - 37 U/L   ALT 29 0 - 53 U/L   Total Protein 7.2 6.0 - 8.3 g/dL   Albumin 4.6 3.5 - 5.2 g/dL   GFR 81.19 >14.78 mL/min    Comment: Calculated using the CKD-EPI Creatinine Equation (2021)   Calcium 10.0 8.4 - 10.5 mg/dL  PSA     Status: Abnormal   Collection Time: 03/20/23  9:04 AM  Result Value Ref Range   PSA 8.88 (H) 0.10 - 4.00 ng/mL    Comment: Test performed using Access Hybritech PSA Assay, a parmagnetic partical, chemiluminecent immunoassay.  REFLEXIVE URINE CULTURE     Status: None   Collection Time: 03/20/23   9:04 AM  Result Value Ref Range   Reflexve Urine Culture      Comment: NO CULTURE INDICATED  Lipid panel     Status: Abnormal   Collection Time: 03/20/23  9:05 AM  Result Value Ref Range   Cholesterol 181 0 - 200 mg/dL    Comment: ATP III Classification       Desirable:  < 200 mg/dL               Borderline High:  200 - 239 mg/dL          High:  > = 295 mg/dL   Triglycerides 621.3 (H) 0.0 - 149.0 mg/dL    Comment: Normal:  <086 mg/dLBorderline High:  150 - 199 mg/dL   HDL 57.84 >69.62 mg/dL   VLDL 95.2 0.0 - 84.1 mg/dL   LDL Cholesterol 87 0 - 99 mg/dL   Total CHOL/HDL Ratio 3     Comment:                Men          Women1/2 Average Risk     3.4          3.3Average Risk          5.0          4.42X Average Risk          9.6          7.13X Average Risk          15.0          11.0                       NonHDL 126.31     Comment: NOTE:  Non-HDL goal should be 30 mg/dL higher than patient's LDL goal (i.e. LDL goal of < 70 mg/dL, would have non-HDL goal of < 100 mg/dL)        Garner Nash, MD, MS

## 2023-05-16 ENCOUNTER — Other Ambulatory Visit: Payer: Self-pay

## 2023-05-16 ENCOUNTER — Ambulatory Visit: Payer: Self-pay | Admitting: Family Medicine

## 2023-05-16 ENCOUNTER — Emergency Department (HOSPITAL_COMMUNITY)
Admission: EM | Admit: 2023-05-16 | Discharge: 2023-05-16 | Disposition: A | Discharge status: Home / Self Care | Payer: 59 | Attending: Emergency Medicine | Admitting: Emergency Medicine

## 2023-05-16 DIAGNOSIS — R110 Nausea: Secondary | ICD-10-CM | POA: Diagnosis not present

## 2023-05-16 DIAGNOSIS — R42 Dizziness and giddiness: Secondary | ICD-10-CM | POA: Diagnosis not present

## 2023-05-16 DIAGNOSIS — Z5321 Procedure and treatment not carried out due to patient leaving prior to being seen by health care provider: Secondary | ICD-10-CM | POA: Insufficient documentation

## 2023-05-16 NOTE — ED Triage Notes (Signed)
Patient to ED by POV wit c/o dizziness associated with vertigo. Per patient symtpoms started last night. He is experiencing shakes, unsteady gait, nausea. Took Meclizine with no relief. 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

## 2023-05-16 NOTE — Telephone Encounter (Signed)
  Chief Complaint: dizziness Symptoms: vomiting, sweating, slight H/A  Frequency: today Pertinent Negatives: Patient denies fever, chest pain, SOB, numbness/tingling, injuries, fever, earache Disposition: [] ED /[] Urgent Care (no appt availability in office) / [] Appointment(In office/virtual)/ []  [facility]/ [] Home Care/ [] Refused Recommended Disposition /[] [facility]/ []  Follow-up with PCP Additional Notes: Patient c/o dizziness episode that occurred early this AM. Endorsed vomiting, sweating, slight H/A along with dizziness that lasted about 1.5 hours. Patient went to ED to be evaluated but was only triaged, so left due to long wait and being there all day. Pt denies SOB, chest pain, numbness/tingling, fevers, and current dizziness. Reports that initially thought d/t ozempic but now concerned that it may be r/t cardiac or ear issues. Scheduled patient per protocol on 05/17/2023. Patient verbalized understanding and to call back with worsening symptoms.   Copied from CRM [id number]. Topic: Clinical - Red Word Triage >> May 16, 2023  5:16 PM Denese Killings wrote: Red Word that prompted transfer to Nurse Triage: Patient experienced vertigo early this morning around 2 a.m. Patient had symptoms of sweating, throwing up, and his balance was off. Reason for Disposition  Vomiting occurs with dizziness  Answer Assessment - Initial Assessment Questions 1. DESCRIPTION: "Describe your dizziness."     Couldn't get my balance, vertigo, sweating, vomiting 2. VERTIGO: "Do you feel like either you or the room is spinning or tilting?"      I just felt like I was dizzy and couldn't get my bearings - everything was spinning in my head 3. LIGHTHEADED: "Do you feel lightheaded?" (e.g., somewhat faint, woozy, weak upon standing)     Yes, and then I threw up 4. SEVERITY: "How bad is it?"  "Can you walk?"   - MILD: Feels slightly dizzy and unsteady, but is walking normally.   - MODERATE: Feels  unsteady when walking, but not falling; interferes with normal activities (e.g., school, work).   - SEVERE: Unable to walk without falling, or requires assistance to walk without falling.     1.5 hour Currently able to walk OK, but this morning was a little unbalanced 5. ONSET:  "When did the dizziness begin?"     0200 this AM - went ED but was only triaged and not evaluated 6. AGGRAVATING FACTORS: "Does anything make it worse?" (e.g., standing, change in head position)     Nothing, tried to take meclezine but I threw that up 7. CAUSE: "What do you think is causing the dizziness?"     PCP thought to be d/t ozempic, but now unsure 8. RECURRENT SYMPTOM: "Have you had dizziness before?" If Yes, ask: "When was the last time?" "What happened that time?"     Yes, PCP initially thought d/t ozempic but then referred to cardiology 9. OTHER SYMPTOMS: "Do you have any other symptoms?" (e.g., headache, weakness, numbness, vomiting, earache)     Vomiting during episode, slight H/A Denies ear aches  Protocols used: Dizziness - Vertigo-A-AH

## 2023-05-17 ENCOUNTER — Encounter: Payer: Self-pay | Admitting: Family Medicine

## 2023-05-17 ENCOUNTER — Ambulatory Visit (INDEPENDENT_AMBULATORY_CARE_PROVIDER_SITE_OTHER): Payer: 59 | Admitting: Family Medicine

## 2023-05-17 VITALS — BP 104/68 | HR 70

## 2023-05-17 DIAGNOSIS — H8309 Labyrinthitis, unspecified ear: Secondary | ICD-10-CM | POA: Diagnosis not present

## 2023-05-17 NOTE — Progress Notes (Signed)
Established Patient Office Visit   Subjective:  Patient ID: Scott Kratochvil., male    DOB: 1965-08-15  Age: 58 y.o. MRN: [id number]  Chief Complaint  Patient presents with   Dizziness    Dizziness, nausea and vomiting, headache, Since yesterday, vibrating sensations in let ear    Dizziness Pertinent negatives include no abdominal pain, chills, congestion, coughing, fever, myalgias, rash, sore throat or weakness.   Encounter Diagnoses  Name Primary?   Labyrinthitis, unspecified laterality Yes   2-day history of dizziness described as a spinning sensation associated with intermittent nausea and vomiting.  There has been a mild headache.  Denies fevers chills, difficulty swallowing or weakness.  No recent URI signs and symptoms.  Discomfort in the left ear.  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   Review of Systems  Constitutional: Negative.  Negative for chills and fever.  HENT:  Positive for hearing loss. Negative for congestion and sore throat.   Eyes:  Negative for blurred vision, discharge and redness.  Respiratory: Negative.  Negative for cough.   Cardiovascular: Negative.   Gastrointestinal:  Negative for abdominal pain.  Genitourinary: Negative.   Musculoskeletal: Negative.  Negative for myalgias.  Skin:  Negative for rash.  Neurological:  Positive for dizziness. Negative for tingling, speech change, focal weakness, loss of consciousness and weakness.  Endo/Heme/Allergies:  Negative for polydipsia.     Current Outpatient Medications:    esomeprazole (NEXIUM) 40 MG capsule, Take 1 capsule 1 hour before breakfast in the morning, Disp: 30 capsule, Rfl: 3   meclizine (ANTIVERT) 25 MG tablet, Take 0.5-1 tablets (12.5-25 mg total) by mouth 3 (three) times daily as needed for dizziness or nausea., Disp: 30 tablet, Rfl: 0   meloxicam (MOBIC) 15 MG tablet, Take 1 tablet (15 mg total) by mouth daily., Disp: 90 tablet, Rfl: 3   Multiple  Vitamin (MULTIVITAMIN ADULT PO), Take by mouth daily. TAKE ONE DAILY, Disp: , Rfl:    olmesartan (BENICAR) 5 MG tablet, Take 2 tablets (10 mg total) by mouth daily., Disp: 180 tablet, Rfl: 3   rosuvastatin (CRESTOR) 10 MG tablet, Take 1 tablet by mouth once daily, Disp: 90 tablet, Rfl: 0   Semaglutide,0.25 or 0.5MG /DOS, (OZEMPIC, 0.25 OR 0.5 MG/DOSE,) 2 MG/3ML SOPN, Inject 0.25 mg into the skin once a week., Disp: 9 mL, Rfl: 0   albuterol (VENTOLIN HFA) 108 (90 Base) MCG/ACT inhaler, Inhale 2 puffs into the lungs every 6 (six) hours as needed for wheezing or shortness of breath. (Patient not taking: Reported on 03/20/2023), Disp: 6.7 g, Rfl: 2   Blood Glucose Monitoring Suppl DEVI, 1 each by Does not apply route in the morning, at noon, and at bedtime. May substitute to any manufacturer covered by patient's insurance. (Patient not taking: Reported on 05/17/2023), Disp: 1 each, Rfl: 0   clobetasol (TEMOVATE) 0.05 % external solution, Apply topically. (Patient not taking: Reported on 03/20/2023), Disp: , Rfl:    polyethylene glycol powder (GLYCOLAX/MIRALAX) 17 GM/SCOOP powder, Take 17 g by mouth daily. (Patient not taking: Reported on 05/17/2023), Disp: 500 g, Rfl: 0   promethazine-dextromethorphan (PROMETHAZINE-DM) 6.25-15 MG/5ML syrup, Take 5 mLs by mouth 4 (four) times daily as needed for cough. (Patient not taking: Reported on 05/17/2023), Disp: 118 mL, Rfl: 0   triamcinolone cream (KENALOG) 0.1 %, Apply topically. (Patient not taking: Reported on 05/17/2023), Disp: , Rfl:    Objective:     BP 104/68 (BP Location: Right Arm,  Patient Position: Sitting, Cuff Size: Normal)   Pulse 70   SpO2 97%    Physical Exam Constitutional:      General: He is not in acute distress.    Appearance: Normal appearance. He is not ill-appearing, toxic-appearing or diaphoretic.  HENT:     Head: Normocephalic and atraumatic.     Right Ear: Tympanic membrane, ear canal and external ear normal.     Left Ear: Tympanic  membrane, ear canal and external ear normal.     Mouth/Throat:     Mouth: Mucous membranes are moist.     Pharynx: Oropharynx is clear. No oropharyngeal exudate or posterior oropharyngeal erythema.  Eyes:     General: No visual field deficit or scleral icterus.       Right eye: No discharge.        Left eye: No discharge.     Extraocular Movements: Extraocular movements intact.     Conjunctiva/sclera: Conjunctivae normal.     Pupils: Pupils are equal, round, and reactive to light.  Cardiovascular:     Rate and Rhythm: Normal rate and regular rhythm.  Pulmonary:     Effort: Pulmonary effort is normal. No respiratory distress.     Breath sounds: Normal breath sounds.  Abdominal:     General: Bowel sounds are normal.     Tenderness: There is no abdominal tenderness. There is no guarding.  Musculoskeletal:     Cervical back: No rigidity or tenderness.  Skin:    General: Skin is warm and dry.  Neurological:     Mental Status: He is alert and oriented to person, place, and time.     Cranial Nerves: Cranial nerves 2-12 are intact. No dysarthria or facial asymmetry.     Motor: No weakness.  Psychiatric:        Mood and Affect: Mood normal.        Behavior: Behavior normal.      No results found for any visits on 05/17/23.    The 10-year ASCVD risk score (Arnett DK, et al., 2019) is: 8.1%    Assessment & Plan:   Labyrinthitis, unspecified laterality    Return Schedule follow-up with Dr. Janee Morn..  Continue with meclizine.  Please hydrate well.  Information was given on labyrinthitis and dehydration.  Did not tilt with orthostatic checks.  Mliss Sax, MD

## 2023-05-29 ENCOUNTER — Encounter: Payer: Self-pay | Admitting: Family Medicine

## 2023-05-29 ENCOUNTER — Ambulatory Visit: Payer: 59 | Admitting: Neurology

## 2023-05-29 ENCOUNTER — Ambulatory Visit: Payer: 59 | Admitting: Family Medicine

## 2023-05-29 VITALS — BP 118/78 | HR 64 | Temp 97.6°F | Wt 217.0 lb

## 2023-05-29 DIAGNOSIS — Z87891 Personal history of nicotine dependence: Secondary | ICD-10-CM

## 2023-05-29 DIAGNOSIS — E1151 Type 2 diabetes mellitus with diabetic peripheral angiopathy without gangrene: Secondary | ICD-10-CM | POA: Diagnosis not present

## 2023-05-29 DIAGNOSIS — G4733 Obstructive sleep apnea (adult) (pediatric): Secondary | ICD-10-CM | POA: Diagnosis not present

## 2023-05-29 DIAGNOSIS — K921 Melena: Secondary | ICD-10-CM | POA: Diagnosis not present

## 2023-05-29 DIAGNOSIS — H8113 Benign paroxysmal vertigo, bilateral: Secondary | ICD-10-CM | POA: Insufficient documentation

## 2023-05-29 DIAGNOSIS — K219 Gastro-esophageal reflux disease without esophagitis: Secondary | ICD-10-CM

## 2023-05-29 DIAGNOSIS — R0683 Snoring: Secondary | ICD-10-CM

## 2023-05-29 DIAGNOSIS — R61 Generalized hyperhidrosis: Secondary | ICD-10-CM

## 2023-05-29 DIAGNOSIS — H903 Sensorineural hearing loss, bilateral: Secondary | ICD-10-CM

## 2023-05-29 DIAGNOSIS — Z7985 Long-term (current) use of injectable non-insulin antidiabetic drugs: Secondary | ICD-10-CM

## 2023-05-29 DIAGNOSIS — Z9621 Cochlear implant status: Secondary | ICD-10-CM | POA: Diagnosis not present

## 2023-05-29 DIAGNOSIS — E669 Obesity, unspecified: Secondary | ICD-10-CM

## 2023-05-29 NOTE — Patient Instructions (Addendum)
 Our office received a referral from Dr. Fanny Bien, M.D. to schedule an appointment. If you will give our office a call at your convenience to discuss scheduling at  [phone] option 1   Thank you, [facility]   VISIT SUMMARY:  Today, we discussed your recent episodes of dizziness and vertigo, your history of rectal bleeding, management of your type 2 diabetes, and follow-up for your cochlear implant. We reviewed your symptoms, current treatments, and made plans for further evaluation and management.  YOUR PLAN:  -BENIGN PAROXYSMAL POSITIONAL VERTIGO (BPPV): BPPV is a condition where small crystals in your inner ear become dislodged, causing dizziness and vertigo, especially with changes in head position. We discussed performing the Epley maneuver at home with a partner and provided vestibular rehabilitation exercises. If your symptoms persist, we will refer you to an ENT specialist or vestibular rehab.  -RECTAL BLEEDING: You experienced rectal bleeding about a month ago, which has since resolved. Given your family history of colon cancer, it is important to follow up with a gastroenterologist to rule out any serious conditions. We provided contact information for gastroenterology and emphasized the need to schedule and attend this appointment.  -TYPE 2 DIABETES MELLITUS: Your diabetes is currently managed with semaglutide (Ozempic) at a dose of 0.25 mg. Your last HbA1c was well-controlled at 6.0. We discussed the importance of staying hydrated and eating enough fruits and vegetables to prevent constipation, which can be a side effect of your medication. Continue taking your medication as prescribed.  -COCHLEAR IMPLANT FOLLOW-UP: Your cochlear implant, placed in 2017-2018, is now out of warranty and needs evaluation. We will refer you to an ENT specialist at [facility] for a comprehensive evaluation and management of your cochlear implant.  INSTRUCTIONS:  Please follow up with  the ENT specialist for your cochlear implant evaluation and management, and ensure you schedule and attend your appointment with the gastroenterologist to address the rectal bleeding. Continue performing the Epley maneuver and vestibular rehabilitation exercises at home, and if your vertigo symptoms persist, contact us for a referral to an ENT or vestibular rehab specialist. Vestibular (Balance) Exercises Introduction  You have a problem with your balance or equilibrium. Do not be afraid of your dizziness. Only you can build up the tolerance in your brain to overcome your dizziness. It is like an exercise for muscle building. It requires regular, capacity-extending work to build up strength or tolerance. Keep provoking your dizziness many times each day, realizing that each purposeful, controlled episode of dizziness brings you closer to your last one. Once you have gained a measure of improvement and control in your practice sessions, seek out sports or other movement activities that have been difficult and spend increasing lengths of time on them until they no longer produce any symptoms.  How do vestibular exercises work?  The purpose of these exercises is to improve one's central or brain's compensation for injuries or abnormalities within the vestibular or balance system. The brain interprets information gained from the vestibular or balance system. When there is an injury or abnormality in any portion of this system, the brain must be retrained or taught to interpret correctly the information it receives. Vestibular exercises merely stimulate the vestibular apparatus. This stimulation produces information to be processed by the brain. The goal in repeating these exercises is for the brain to learn to tolerate and accurately interpret this type of stimulation. By doing these exercises repetitively, one can even teach the brain to adapt to an abnormal stimulus. These  exercises work in much the same way  as the exercises skaters or dancers do to keep from becoming dizzy when they spin around rapidly. Stated simply, one must seek out and overcome those positions or situations which cause dizziness. Avoiding them will only prolong one's convalescence.  Aims of exercises:  To train movement of the eyes, independent of the head. To practice balancing in everyday situations with special attention to developing the use of the eyes and the muscle sense awareness. To practice head movements that cause dizziness. To become accustomed to moving about naturally in daylight and in the dark. Generally, to encourage the re-building of confidence in making easy, relaxed, spontaneous movements. When you begin:  During the first few times the exercise is performed, you should have another person present in case the dizziness becomes very severe.  When should you stop doing the exercise?  These exercises should be done at least three times a day for a minimum of 6 to 12 weeks or until the dizziness goes away altogether. Stopping before complete resolution of dizziness often results in a relapse in symptoms. The point at which one stops the exercises is when one has no dizziness for two consecutive weeks. The exercise may be stopped and restarted again at any time if dizziness returns.  The following exercises should be performed twice daily. The exercises are designed to challenge your balance system and often cause symptoms of dizziness. This is a normal response to these stimulating exercises. You should try to work through these symptoms if possible. If you feel you cannot, have your nurse contact an inpatient physical therapist for assistance.  Methods:  Work up to doing each movement 20 times. All exercises are started in exaggerated slow time and gradually increase speed to a more rapid rate. Be sure to continue exercises even though you become dizzy. Pause and rest only if you become nauseated or sick to  your stomach. If ill, after resting, try a different exercise. If you still become sick to your stomach, postpone further work until your next session.  Head Exercises  Bending: In a sitting position, bend your head down to look at the floor then up to look at the ceiling. Lead your head with your eyes focusing on the floor and the ceiling. Repeat this 10 times. Stop and wait for symptoms to resolve, about 30 seconds. Repeat entire process 2 more times. Turning (side to side): In a sitting position, turn your head to the right and then left, leading your head with your eyes as if you are watching a tennis match. Turn your head at a speed brisk enough to generate symptoms but not so fast that you strain your neck. (Slowly first, then quickly.) Go back and forth 10 times, and then wait for 30 seconds (or until symptoms resolve). Repeat entire process 2 more times. As the dizziness improves:  Perform head exercises with eyes closed. Progress to standing while performing head exercises. The closer together you put your feet, the more challenging it becomes.     Sitting  Shrug shoulders - 20 times. Turn shoulders to right and then to left - 20 times. Rotate head, shoulders and trunk - 20 times each: Rotate upper body right to left with eyes open, then repeat with eyes closed. Rotate upper body left to right with eyes open, then repeat with eyes closed. Bend forward and touch ground then sit up. Keep eyes focused on wall - 20 times. Bend forward and touch ground  then sit up. Move eyes to floor and back - 20 times. Eye movements (head is still): Up and down (focusing on finger). Side to side (focusing on finger). Finger to tip of nose and out (focusing on finger).  Standing Change from sitting to standing and back again 20 times with eyes open. Repeat with eyes closed.  Standing with one foot in front of the other In a corner, practice standing "heel to toe" (one foot in front of  the other with the heel of one foot touching the toe of the other foot) with eyes open for 30 seconds. The goal is to stand for the entire 30 seconds without touching the wall. You may make this more challenging by crossing arms across chest. If this is too hard at first, try standing "almost heel to toe" (with feet touching at big toes and ankles). Once you have mastered these with eyes open, practice with eyes closed.  Standing on a cushion In a corner, stand on a couch cushion or several pillows. Try to stand still without touching the wall for 30 seconds. Practice with eyes open. When this is easy, practice with eyes closed. You may make this more challenging by placing feet closer together. Crossing arm across your chest also makes this more challenging. You should progress by performing this in the most challenging position possible.  Standing and throwing Throw a small rubber ball from hand to hand above eye level.  Throw ball from hand to hand under one knee. Stand with heels together Look straight ahead and hold balance (attempt only with assistance).  Stand on one foot Perform first with eyes open then with eyes closed (attempt only with assistance).  Miscellaneous Do activities involving stooping, stretching, bending, going up and down stairs (attempt only with assistance).  Walking Walking in a straight line In a hallway or next to a wall, practice walking in a straight line for 5 minutes with one foot in front of the other or "heel to toe" (with the heel of one foot touching the toe of the other foot). If this is too hard at first, practice walking "almost heel to toe" and gradually work to heel to toe touching.  Walking combined with head turning In a hallway or open space, practice walking in a straight line while turning head and eyes left and right with every other step (i.e. when you step with your left you look left, when you step with your right you look right). Continue  for the length of the hallway or about 20 feet. Repeat the process 3 times. Now repeat the entire process 3 more times but this time looking at the ceiling or floor. You will need to rest between repetitions and let symptoms calm.  Walk across the room Perform first with eyes open, then with eyes closed (attempt only with assistance).  Lying Down Sitting on side of bed, quickly lie down to your left side swinging your feet onto the bed as you do. Lie there for 30 seconds or until symptoms resolve. Repeat 3 times. Now repeat 3 times to the right.  Eye Exercises - Gaze Stabilization Tips  Target must remain in focus, not blurry and appear stationary while head is in motion. Perform exercise with small head movement (45 degrees on either side). Speed of head motion should be increased as long as target remains in focus.  If you wear glasses, wear them while performing exercises. These exercises may provoke dizziness or nausea. Try to  work through these symptoms. Rest between each exercise. Exercises demand full concentration. Avoid distractions. For safety, standing exercises should be performed next to a counter or next to someone.  Gaze Stabilization Keep eyes fixed on a single stationary target held in hand or placed on a wall 3-10 feet away. Now move head side to side for 30 seconds. Repeat 3 times. Now repeat 3 times while moving head up and down for 30 seconds. Do 3 sessions per day. You may progress this by beginning in a sitting position then move to standing with feet apart, standing with feet together, standing heel to toe, marching in place, or standing on form. This will also be more difficult if object you are focusing on is placed on a "busy wallpaper" or a checkerboard.  Smooth pursuit Holding a single target, keep eyes fixed on the target. Slowly move it side to side for 30 seconds while head stays still. Perform in the sitting position. Progress to the standing position as  tolerated. Now repeat moving head up and down. Repeat 20 times in each direction per session. Do 3 sessions per day.  Head and eyes same direction Holding a single target (playing card or pencil) keep eyes fixed on target. Slowly move target, head, and eyes in same direction (up and down, side to side) for 30 seconds. Perform in sitting position, you can progress this to standing as you improve. Repeat 3 times per session. This will be more difficult if you have a "background" of a busy wallpaper. Do 3 sessions per day.  Head and eyes opposite direction Holding your target, keep your eyes focused on it and begin to slowly move target (up and down, side to side) while moving your head in the opposite direction of the target for 30 seconds. Repeat 3 times per session. You may progress from sitting to standing as in above exercise. Do 3 sessions per day. Continue to perform these exercises at least twice daily until your symptoms resolve.   If these exercises do not produce symptoms, you do not need to continue them.  Begin a walking program at home. You should walk 5 days a week. Start by walking for 5 minutes. Increase the length of walking time by 5 minutes each week until you can walk for 30 minutes continuously.

## 2023-05-29 NOTE — Progress Notes (Signed)
Assessment/Plan:  Total time spent caring for the patient today was 32 minutes. This includes time spent before the visit reviewing the chart, time spent during the visit, and time spent after the visit on documentation, etc.     Benign Paroxysmal Positional Vertigo (BPPV) Intermittent episodes of dizziness and vertigo triggered by positional changes. Symptoms include sweating, vomiting, and imbalance. Meclizine effective but causes drowsiness. No recent episodes in the past week. Likely due to dislodged otoliths in the semicircular canals. Discussed Epley maneuver and vestibular rehabilitation exercises as non-pharmacological treatments. Referral to vestibular rehab if symptoms persist. - Provide instructions for Epley maneuver to be performed at home with a partner - Provide vestibular rehabilitation exercises - Refer to ENT  - Consider referral to vestibular rehab if home exercises are ineffective  Rectal Bleeding Rectal bleeding noted in January, no recent episodes. Previous labs showed no dehydration or anemia. Discussed need for follow-up with gastroenterology to rule out serious conditions like colon cancer, given family history. - Provide contact information for gastroenterology - Ensure patient schedules and attends GI appointment  Type 2 Diabetes Mellitus Managed with semaglutide (Ozempic) at 0.25 mg weekly. Last HbA1c well-controlled. Discussed importance of hydration and dietary fiber to prevent constipation, a potential side effect of semaglutide. 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 Needs evaluation and potential update. Discussed referral to [facility] for comprehensive evaluation and management. - Refer to [facility] for  cochlear implant evaluation and management  Follow-up - Print and provide all relevant information and instructions - Ensure patient follows up with ENT and GI as discussed.        There are no discontinued medications.  Return if symptoms worsen or fail to improve.    Subjective:   Encounter date: 05/29/2023  Scott Shannon. is a 58 y.o. male who has Sensorineural hearing loss (SNHL) of both ears; Hyperlipidemia associated with type 2 diabetes mellitus (HCC); Primary osteoarthritis involving multiple joints; Primary insomnia; Snoring; Type 2 diabetes mellitus with hyperglycemia (HCC); Hyperhidrosis; Hypertension associated with diabetes (HCC); Psoriasis; History of tobacco use; Pulmonary emphysema (HCC); COVID-19; Class 1 obesity due to excess calories with serious comorbidity and body mass index (BMI) of 30.0 to 30.9 in adult; Apnea; Gastroesophageal reflux disease without esophagitis; Increased frequency of urination; Loud snoring; GERD with apnea; Obesity (BMI 35.0-39.9 without comorbidity); DM (diabetes mellitus) type II, controlled, with peripheral vascular disorder (HCC); Incomplete right bundle branch block (RBBB) determined by electrocardiography; Dizziness; Diverticulosis; Hematochezia; Constipation; Labyrinthitis; Uses cochlear implant; and BPPV (benign paroxysmal positional vertigo), bilateral on their problem list..   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 Tx with meclizine. Last dizziness episode was 2 weeks ago. ) .   Discussed the use of AI scribe software for clinical note transcription with the patient, who gave verbal consent to proceed.  History of Present Illness   Scott Poteete  Max Shannon. is a 58 year old male who presents with dizziness and vertigo symptoms.  He experiences dizziness and vertigo, with episodes characterized by profuse sweating, vomiting, and severe imbalance, necessitating assistance to move. These episodes often occur when lying down and turning over in bed. He has had three episodes before this visit, the last occurring about a week ago. Meclizine, taken as needed in doses of 12.5 to 25 mg, provides relief but causes drowsiness. No chest pain or syncope is reported, but profuse sweating and vomiting occur during vertigo episodes.  He has a cochlear implant on the right side, placed in 2017 or 2018. The warranty for the device is no longer supported, and he prefers to see an ear specialist closer to his location rather than traveling to [facility] for further management.  He has a history of rectal bleeding noted about a month ago, which has since resolved. He was referred to gastroenterology for further evaluation, but he was unable to reach him. There is a family history of colon cancer.  He manages his diabetes with Ozempic, having reduced the dose to 0.25 mg due to previous gastrointestinal symptoms. He reports no current GI symptoms, and his last A1c was improved.       Review of Systems  All other systems reviewed and are negative.   Past Surgical History:  Procedure Laterality Date   HAND SURGERY Bilateral    HEAD SUTURES     HIP REPLACEMENT Left    URINARY TRACT SURGERY     AS A CHILD    Outpatient Medications Prior to Visit  Medication Sig Dispense Refill   esomeprazole (NEXIUM) 40 MG capsule Take 1 capsule 1 hour before breakfast in the morning 30 capsule 3   meclizine (ANTIVERT) 25 MG  tablet Take 0.5-1 tablets (12.5-25 mg total) by mouth 3 (three) times daily as needed for dizziness or nausea. 30 tablet 0   meloxicam (MOBIC) 15 MG tablet Take 1 tablet (15 mg total) by mouth daily. 90 tablet 3   Multiple Vitamin (MULTIVITAMIN ADULT PO) Take by mouth daily. TAKE ONE DAILY     olmesartan (BENICAR) 5 MG tablet Take 2 tablets (10 mg total) by mouth daily. 180 tablet 3   rosuvastatin (CRESTOR) 10 MG tablet Take 1 tablet by mouth once daily 90 tablet 0   Semaglutide,0.25 or 0.5MG /DOS, (OZEMPIC, 0.25 OR 0.5 MG/DOSE,) 2 MG/3ML SOPN Inject 0.25 mg into the skin once a week. 9 mL 0   albuterol (VENTOLIN HFA) 108 (90 Base) MCG/ACT inhaler Inhale 2 puffs into the lungs every 6 (six) hours as needed for wheezing or shortness of breath. (Patient not taking: Reported on 05/29/2023) 6.7 g 2   Blood Glucose Monitoring Suppl DEVI 1 each by Does not apply route in the morning, at noon, and at bedtime. May substitute to any manufacturer covered by patient's insurance. (Patient not taking: Reported on 04/24/2023) 1 each 0   clobetasol (TEMOVATE) 0.05 % external solution Apply topically. (Patient not taking: Reported on 05/29/2023)     polyethylene glycol powder (GLYCOLAX/MIRALAX) 17 GM/SCOOP powder Take 17 g by mouth daily. (Patient not taking: Reported on 05/29/2023) 500 g 0   promethazine-dextromethorphan (PROMETHAZINE-DM) 6.25-15 MG/5ML syrup Take 5 mLs by mouth 4 (four) times daily as needed for cough. (Patient not taking: Reported on 05/08/2023) 118 mL 0   triamcinolone cream (KENALOG) 0.1 % Apply topically. (Patient not taking: Reported on 05/29/2023)     No facility-administered medications prior to visit.  Family History  Problem Relation Age of Onset   Colon polyps Mother    Colon polyps Father    Cancer Father    Colon cancer Neg Hx    Crohn's disease Neg Hx    Esophageal cancer Neg Hx    Rectal cancer Neg Hx    Stomach cancer Neg Hx     Social History   Socioeconomic History    Marital status: Married    Spouse name: Not on file   Number of children: Not on file   Years of education: Not on file   Highest education level: 12th grade  Occupational History   Not on file  Tobacco Use   Smoking status: Former    Current packs/day: 0.00    Average packs/day: 2.0 packs/day for 30.0 years (60.0 ttl pk-yrs)    Types: Cigarettes    Start date: 60    Quit date: 2022    Years since quitting: 3.1   Smokeless tobacco: Never  Vaping Use   Vaping status: Never Used  Substance and Sexual Activity   Alcohol use: Yes    Comment: occ   Drug use: Never   Sexual activity: Yes  Other Topics Concern   Not on file  Social History Narrative   Not on file   Social Drivers of Health   Financial Resource Strain: Low Risk  (04/30/2023)   Overall Financial Resource Strain (CARDIA)    Difficulty of Paying Living Expenses: Not hard at all  Food Insecurity: No Food Insecurity (04/30/2023)   Hunger Vital Sign    Worried About Running Out of Food in the Last Year: Never true    Ran Out of Food in the Last Year: Never true  Transportation Needs: No Transportation Needs (04/30/2023)   PRAPARE - Administrator, Civil Service (Medical): No    Lack of Transportation (Non-Medical): No  Physical Activity: Insufficiently Active (04/30/2023)   Exercise Vital Sign    Days of Exercise per Week: 4 days    Minutes of Exercise per Session: 20 min  Stress: No Stress Concern Present (04/30/2023)   [employer] of Occupational Health - Occupational Stress Questionnaire    Feeling of Stress : Not at all  Social Connections: Socially Isolated (04/30/2023)   Social Connection and Isolation Panel [NHANES]    Frequency of Communication with Friends and Family: Never    Frequency of Social Gatherings with Friends and Family: Never    Attends Religious Services: Never    Database administrator or Organizations: No    Attends Banker Meetings: Never    Marital Status:  Married  Catering manager Violence: Not At Risk (11/13/2022)   Humiliation, Afraid, Rape, and Kick questionnaire    Fear of Current or Ex-Partner: No    Emotionally Abused: No    Physically Abused: No    Sexually Abused: No                                                                                                  Objective:  Physical Exam: BP 118/78  Pulse 64   Temp 97.6 F (36.4 C) (Temporal)   Wt 217 lb (98.4 kg)   SpO2 95%   BMI 30.27 kg/m     Physical Exam Constitutional:      Appearance: Normal appearance.  HENT:     Head: Normocephalic and atraumatic.     Nose: Nose normal.  Eyes:     General: Vision grossly intact. Gaze aligned appropriately. No scleral icterus.       Right eye: No discharge.        Left eye: No discharge.     Extraocular Movements: Extraocular movements intact.     Pupils: Pupils are equal, round, and reactive to light.  Cardiovascular:     Rate and Rhythm: Normal rate and regular rhythm.     Heart sounds: Normal heart sounds.  Pulmonary:     Effort: Pulmonary effort is normal.     Breath sounds: Normal breath sounds.  Abdominal:     Palpations: Abdomen is soft.     Tenderness: There is no abdominal tenderness.  Skin:    General: Skin is warm.     Findings: No rash.  Neurological:     General: No focal deficit present.     Mental Status: He is alert.     Cranial Nerves: No cranial nerve deficit.  Psychiatric:        Mood and Affect: Mood normal.        Behavior: Behavior normal.        Thought Content: Thought content normal.        Judgment: Judgment normal.     .orthos  No results found.  Recent Results (from the past 2160 hours)  POCT glycosylated hemoglobin (Hb A1C)     Status: Abnormal   Collection Time: 03/20/23  8:34 AM  Result Value Ref Range   Hemoglobin A1C 6.5 (A) 4.0 - 5.6 %   HbA1c POC (<> result, manual entry)     HbA1c, POC (prediabetic range)     HbA1c, POC (controlled diabetic range)    Urinalysis  w microscopic + reflex cultur     Status: None   Collection Time: 03/20/23  9:04 AM   Specimen: Urine  Result Value Ref Range   Color, Urine YELLOW YELLOW   APPearance CLEAR CLEAR   Specific Gravity, Urine 1.012 1.001 - 1.035   pH 6.0 5.0 - 8.0   Glucose, UA NEGATIVE NEGATIVE   Bilirubin Urine NEGATIVE NEGATIVE   Ketones, ur NEGATIVE NEGATIVE   Hgb urine dipstick NEGATIVE NEGATIVE   Protein, ur NEGATIVE NEGATIVE   Nitrites, Initial NEGATIVE NEGATIVE   Leukocyte Esterase NEGATIVE NEGATIVE   WBC, UA NONE SEEN 0 - 5 /HPF   RBC / HPF NONE SEEN 0 - 2 /HPF   Squamous Epithelial / HPF NONE SEEN < OR = 5 /HPF   Bacteria, UA NONE SEEN NONE SEEN /HPF   Hyaline Cast NONE SEEN NONE SEEN /LPF   Note      Comment: This urine was analyzed for the presence of WBC,  RBC, bacteria, casts, and other formed elements.  Only those elements seen were reported. Marland Kitchen Donnella Bi / creatinine urine ratio     Status: None   Collection Time: 03/20/23  9:04 AM  Result Value Ref Range   Microalb, Ur <0.7 0.0 - 1.9 mg/dL   Creatinine,U 16.1 mg/dL   Microalb Creat Ratio 1.2 0.0 - 30.0 mg/g  Comp Met (CMET)     Status: Abnormal  Collection Time: 03/20/23  9:04 AM  Result Value Ref Range   Sodium 136 135 - 145 mEq/L   Potassium 4.7 3.5 - 5.1 mEq/L   Chloride 100 96 - 112 mEq/L   CO2 28 19 - 32 mEq/L   Glucose, Bld 114 (H) 70 - 99 mg/dL   BUN 19 6 - 23 mg/dL   Creatinine, Ser 4.25 0.40 - 1.50 mg/dL   Total Bilirubin 0.4 0.2 - 1.2 mg/dL   Alkaline Phosphatase 90 39 - 117 U/L   AST 18 0 - 37 U/L   ALT 29 0 - 53 U/L   Total Protein 7.2 6.0 - 8.3 g/dL   Albumin 4.6 3.5 - 5.2 g/dL   GFR 95.63 >87.56 mL/min    Comment: Calculated using the CKD-EPI Creatinine Equation (2021)   Calcium 10.0 8.4 - 10.5 mg/dL  PSA     Status: Abnormal   Collection Time: 03/20/23  9:04 AM  Result Value Ref Range   PSA 8.88 (H) 0.10 - 4.00 ng/mL    Comment: Test performed using Access Hybritech PSA Assay, a parmagnetic  partical, chemiluminecent immunoassay.  REFLEXIVE URINE CULTURE     Status: None   Collection Time: 03/20/23  9:04 AM  Result Value Ref Range   Reflexve Urine Culture      Comment: NO CULTURE INDICATED  Lipid panel     Status: Abnormal   Collection Time: 03/20/23  9:05 AM  Result Value Ref Range   Cholesterol 181 0 - 200 mg/dL    Comment: ATP III Classification       Desirable:  < 200 mg/dL               Borderline High:  200 - 239 mg/dL          High:  > = 433 mg/dL   Triglycerides 295.1 (H) 0.0 - 149.0 mg/dL    Comment: Normal:  <884 mg/dLBorderline High:  150 - 199 mg/dL   HDL 16.60 >63.01 mg/dL   VLDL 60.1 0.0 - 09.3 mg/dL   LDL Cholesterol 87 0 - 99 mg/dL   Total CHOL/HDL Ratio 3     Comment:                Men          Women1/2 Average Risk     3.4          3.3Average Risk          5.0          4.42X Average Risk          9.6          7.13X Average Risk          15.0          11.0                       NonHDL 126.31     Comment: NOTE:  Non-HDL goal should be 30 mg/dL higher than patient's LDL goal (i.e. LDL goal of < 70 mg/dL, would have non-HDL goal of < 100 mg/dL)  CBC w/Diff     Status: None   Collection Time: 05/08/23 10:04 AM  Result Value Ref Range   WBC 7.7 4.0 - 10.5 K/uL   RBC 4.53 4.22 - 5.81 Mil/uL   Hemoglobin 14.2 13.0 - 17.0 g/dL   HCT 23.5 57.3 - 22.0 %   MCV 93.8 78.0 - 100.0 fl   MCHC  33.3 30.0 - 36.0 g/dL   RDW 16.1 09.6 - 04.5 %   Platelets 265.0 150.0 - 400.0 K/uL   Neutrophils Relative % 62.1 43.0 - 77.0 %   Lymphocytes Relative 26.8 12.0 - 46.0 %   Monocytes Relative 7.7 3.0 - 12.0 %   Eosinophils Relative 2.6 0.0 - 5.0 %   Basophils Relative 0.8 0.0 - 3.0 %   Neutro Abs 4.8 1.4 - 7.7 K/uL   Lymphs Abs 2.1 0.7 - 4.0 K/uL   Monocytes Absolute 0.6 0.1 - 1.0 K/uL   Eosinophils Absolute 0.2 0.0 - 0.7 K/uL   Basophils Absolute 0.1 0.0 - 0.1 K/uL  Basic Metabolic Panel (BMET)     Status: Abnormal   Collection Time: 05/08/23 10:04 AM  Result Value Ref  Range   Sodium 138 135 - 145 mEq/L   Potassium 4.2 3.5 - 5.1 mEq/L   Chloride 103 96 - 112 mEq/L   CO2 28 19 - 32 mEq/L   Glucose, Bld 108 (H) 70 - 99 mg/dL   BUN 19 6 - 23 mg/dL   Creatinine, Ser 4.09 0.40 - 1.50 mg/dL   GFR 81.19 >14.78 mL/min    Comment: Calculated using the CKD-EPI Creatinine Equation (2021)   Calcium 9.9 8.4 - 10.5 mg/dL        Garner Nash, MD, MS

## 2023-05-30 NOTE — Progress Notes (Signed)
 Scott Shannon. Male, 58 y.o., 11-Apr-1966 MRN: [id number]  Scott Shannon. Scott Shannon. (Legal Name)    HOME SLEEP TEST REPORT ( by [facility])   STUDY DATE:  05-29-2023/ data load 05-30-2023    ORDERING CLINICIAN: Melvyn Novas, MD  REFERRING CLINICIAN:  Dr Janee Morn,  Cc: Dr Jacinto Halim   CLINICAL INFORMATION/HISTORY: Scott Shannon. is a 58 y.o. male patient who is seen upon referral on 04/24/2023 from Dr. Janee Morn  for a sleep Consultation.  Chief concern according to patient :  "I woke up from choking sensation, attributed apnea to GERD, treated GERD and since then no longer happening. I am not sleepy, I sleep 4-6 hours each night. I have always slept  about this long , I feel fine with this. "   Nocturia 1-2 times, used to be 3 times until 2 months ago, on Ozempic  for DM, on nexium, GERD- Sleeps slightly elevated too. He has oesophagitis and ulcers -esophageal or gastric ?   Obesity. DM2.    has been dysarthric, cochlear implant right side, nasal speech. Congenital hearing impairment. Reportedly, he gained weight on ozempic, is  loudly snoring, associated with supine sleep. He is edentulous and the dentures support his facial structures during the day , but not at night.  Patient is working as a Civil Service fast streamer, and lives in a household with spouse / male partner - The patient  used to work in shifts( Chief Technology Officer,) as a Naval architect, [employer].    Tobacco use:  quit Thanksgiving 2022.   ETOH use : daily 1 drink with dinner ( 6-8 PM ) one mixed drink..  Caffeine intake in form of Coffee( none) Soda( 1 a day) Tea ( quit a moths ago -)     Epworth sleepiness score: 1/ 24 points  FSS endorsed at 28/ 63 points.    BMI:  31.7 kg/m   Neck Circumference: 19"     FINDINGS:   Sleep Summary:   Total Recording Time (hours, min):    7 hours 19 minutes   Total Sleep Time (hours, min):    5 hours 1 minutes             Percent REM (%):     14%      Sleep  latency was 39 minutes long and REM sleep latency 104 minutes long.  There were 99 minutes of wakefulness after sleep onset.                               Respiratory Indices: I reported the AASM and CMS criteria scored AHI's.  This patient is covered under Medicare plan and the study would be interpreted by CMS guidelines.   Calculated pAHI (per hour):   30/h following AASM criteria.  By CMS criteria which are applied to Medicare patients this AHI is only 17.4/h and would no longer be considered severe but moderate.                          REM pAHI:   By AASM criteria 33.8/h and by CMS criteria 22.1/h.  NREM pAHI:     By CMS criteria 16.6/h                         Positional AHI:   The longest sleep position was supine sleep 422 minutes with an CMS based AHI of 14/h, followed by right lateral sleep with an AHI of 7.8/h, prone sleep had an AHI of 4/h and left lateral sleep an AHI of 26.8/h.  So overall there is a benefit in avoiding supine sleep.  Snoring level was reaching a mean volume of 43 dB.  It was present for about half of the total recorded sleep time.                                                Oxygen Saturation Statistics:   Oxygen Saturation (%) Mean:    The mean oxygen saturation was 93%            O2 Saturation Range (%):     Between a nadir at 86% with a maximum of 100%                                  O2 Saturation (minutes) <89%:     0 minutes      Pulse Rate Statistics:   Pulse Mean (bpm):    53 bpm             Pulse Range:    Between bradycardia at 38 bpm and the maximum heart rate of 90 bpm.  Please note that the cardiac rhythm cannot be evaluated by this home sleep test device.             IMPRESSION:  This HST confirms the presence of extremely fragmented sleep, loud snoring, and a moderately- severe and all- obstructive form of sleep apnea. If scored by AASM criteria,  this constitutes severe OSA.    No central  events were calculated by the device.   No significant clinical hypoxia was seen.   Intermittent bradycardia was frequently seen.   RECOMMENDATION: I would strongly recommend for the patient to use a positive airway pressure therapy device .  Due to the lack of biological teeth it would not be possible to apply  a dental device.  I also want this patient to  further advance his weight loss.  Reduce time in supine and alcohol intake ( it contributes to snoring )   The repeatedly very low heart rate should be evaluated by PCP/ cardiology.  Auto CPAP 5-15 cm water, 2 cm epr and mask of choice.     INTERPRETING PHYSICIAN:   Melvyn Novas, MD  [facility] and [facility] Sleep Board certified by The [employer] of Sleep Medicine and Diplomate of the [employer] of Sleep Medicine. Board certified In Neurology through the ABPN, Fellow of the [employer] of Neurology.

## 2023-06-01 ENCOUNTER — Encounter: Payer: Self-pay | Admitting: Gastroenterology

## 2023-06-12 ENCOUNTER — Encounter: Payer: Self-pay | Admitting: Neurology

## 2023-06-12 NOTE — Addendum Note (Signed)
 Addended by: Melvyn Novas on: 06/12/2023 09:39 AM   Modules accepted: Orders

## 2023-06-12 NOTE — Progress Notes (Signed)
 Sleep study 05/29/2023: This HST confirms the presence of extremely fragmented sleep, loud snoring, and a moderately- severe and all- obstructive form of sleep apnea RECOMMENDATION:Strongly recommend for the patient to use a positive airway pressure therapy device .Marland Kitchen

## 2023-06-12 NOTE — Procedures (Signed)
 [facility] K. Scott Shannon. Male, 58 y.o., 11-Apr-1966 MRN: [id number]  Scott Shannon. Scott Shannon. (Legal Name)    HOME SLEEP TEST REPORT ( by Watch PAT)   STUDY DATE:  05-29-2023/ data load 05-30-2023    ORDERING CLINICIAN: Melvyn Novas, MD  REFERRING CLINICIAN:  Dr Janee Morn,  Cc: Dr Jacinto Halim   CLINICAL INFORMATION/HISTORY: Scott Shannon. is a 58 y.o. male patient who is seen upon referral on 04/24/2023 from Dr. Janee Morn  for a sleep Consultation.  Chief concern according to patient :  "I woke up from choking sensation, attributed apnea to GERD, treated GERD and since then no longer happening. I am not sleepy, I sleep 4-6 hours each night. I have always slept  about this long , I feel fine with this. "   Nocturia 1-2 times, used to be 3 times until 2 months ago, on Ozempic  for DM, on nexium, GERD- Sleeps slightly elevated too. He has oesophagitis and ulcers -esophageal or gastric ?   Obesity. DM2.    has been dysarthric, cochlear implant right side, nasal speech. Congenital hearing impairment. Reportedly, he gained weight on ozempic, is  loudly snoring, associated with supine sleep. He is edentulous and the dentures support his facial structures during the day , but not at night.  Patient is working as a Civil Service fast streamer, and lives in a household with spouse / male partner - The patient  used to work in shifts( Chief Technology Officer,) as a [employer], [employer].    Tobacco use:  quit Thanksgiving 2022.   ETOH use : daily 1 drink with dinner ( 6-8 PM ) one mixed drink..  Caffeine intake in form of Coffee( none) Soda( 1 a day) Tea ( quit a moths ago -)     Epworth sleepiness score: 1/ 24 points  FSS endorsed at 28/ 63 points.    BMI:  31.7 kg/m   Neck Circumference: 19"     FINDINGS:   Sleep Summary:   Total Recording Time (hours, min):    7 hours 19 minutes   Total Sleep Time (hours, min):    5 hours 1 minutes             Percent REM (%):     14%      Sleep  latency was 39 minutes long and REM sleep latency 104 minutes long.  There were 99 minutes of wakefulness after sleep onset.                               Respiratory Indices: I reported the AASM and CMS criteria scored AHI's.  This patient is covered under Medicare plan and the study would be interpreted by CMS guidelines.   Calculated pAHI (per hour):   30/h following AASM criteria.  By CMS criteria which are applied to Medicare patients this AHI is only 17.4/h and would no longer be considered severe but moderate.                          REM pAHI:   By AASM criteria 33.8/h and by CMS criteria 22.1/h.  NREM pAHI:     By CMS criteria 16.6/h                         Positional AHI:   The longest sleep position was supine sleep 422 minutes with an CMS based AHI of 14/h, followed by right lateral sleep with an AHI of 7.8/h, prone sleep had an AHI of 4/h and left lateral sleep an AHI of 26.8/h.  So overall there is a benefit in avoiding supine sleep.  Snoring level was reaching a mean volume of 43 dB.  It was present for about half of the total recorded sleep time.                                                Oxygen Saturation Statistics:   Oxygen Saturation (%) Mean:    The mean oxygen saturation was 93%            O2 Saturation Range (%):     Between a nadir at 86% with a maximum of 100%                                  O2 Saturation (minutes) <89%:     0 minutes      Pulse Rate Statistics:   Pulse Mean (bpm):    53 bpm             Pulse Range:    Between bradycardia at 38 bpm and the maximum heart rate of 90 bpm.  Please note that the cardiac rhythm cannot be evaluated by this home sleep test device.             IMPRESSION:  This HST confirms the presence of extremely fragmented sleep, loud snoring, and a moderately- severe and all- obstructive form of sleep apnea. If scored by AASM criteria,  this constitutes severe OSA.    No central  events were calculated by the device.   No significant clinical hypoxia was seen.   Intermittent bradycardia was frequently seen.   RECOMMENDATION: I would strongly recommend for the patient to use a positive airway pressure therapy device .  Due to the lack of biological teeth it would not be possible to apply  a dental device.  I also want this patient to  further advance his weight loss.  Reduce time in supine and alcohol intake ( it contributes to snoring )   The repeatedly very low heart rate should be evaluated by PCP/ cardiology.  Auto CPAP 5-15 cm water, 2 cm epr and mask of choice.     INTERPRETING PHYSICIAN:   Melvyn Novas, MD  [facility] and [facility] Sleep Board certified by The [employer] of Sleep Medicine and Diplomate of the [employer] of [employer]. Board certified In Neurology through the ABPN, Fellow of the [employer] of [employer].

## 2023-06-13 NOTE — Telephone Encounter (Signed)
 I called pt. I advised pt that Dr. Vickey Huger reviewed their sleep study results and found that pt has sleep apnea. Dr. Vickey Huger recommends that pt starts auto CPAP. I reviewed PAP compliance expectations with the pt. Pt is agreeable to starting a CPAP. I advised pt that an order will be sent to a DME, [facility], and [employer] [facility] will call the pt within about one week after they file with the pt's insurance. [employer] [facility] will show the pt how to use the machine, fit for masks, and troubleshoot the CPAP if needed. A follow up appt will need to be made for insurance purposes with Dr. Vickey Huger or NP within 31-90 days from the date the pt is set up on a cpap machine. Pt verbalized understanding to arrive 15 minutes early and bring their CPAP. Pt verbalized understanding of results. Pt had no questions at this time but was encouraged to call back if questions arise. I have sent the order to [employer] [facility] and have received confirmation that they have received the order.

## 2023-06-13 NOTE — Telephone Encounter (Signed)
-----   Message from [place] Dohmeier sent at 06/12/2023  9:39 AM EST -----  pAHI (per hour):   30/h following AASM criteria.  By CMS criteria which are applied to Medicare patients this AHI is only 17.4/h and would no longer be considered severe but moderate.                       This HST confirms the presence of extremely fragmented sleep, loud snoring, and a moderately- severe and all- obstructive form of sleep apnea. If scored by AASM criteria,  this constitutes severe OSA.   Intermittent bradycardia was frequently seen.   I would strongly recommend for the patient to use a positive airway pressure therapy device .  Due to the lack of biological teeth it would not be possible to apply  a dental device.  I also want this patient to  further advance his weight loss.  Reduce time in supine and alcohol intake ( it contributes to snoring )   The repeatedly very low heart rate should be evaluated by PCP/ cardiology.   Auto CPAP 5-15 cm water, 2 cm epr and mask of choice.   The documented sleep fragmentation may be related to a number of other non-apnea related conditions.

## 2023-06-18 ENCOUNTER — Ambulatory Visit (INDEPENDENT_AMBULATORY_CARE_PROVIDER_SITE_OTHER): Payer: 59 | Admitting: Family Medicine

## 2023-06-18 VITALS — BP 138/84 | HR 75 | Temp 97.6°F | Wt 228.0 lb

## 2023-06-18 DIAGNOSIS — G4733 Obstructive sleep apnea (adult) (pediatric): Secondary | ICD-10-CM | POA: Insufficient documentation

## 2023-06-18 DIAGNOSIS — E785 Hyperlipidemia, unspecified: Secondary | ICD-10-CM | POA: Diagnosis not present

## 2023-06-18 DIAGNOSIS — E1169 Type 2 diabetes mellitus with other specified complication: Secondary | ICD-10-CM

## 2023-06-18 DIAGNOSIS — Z7985 Long-term (current) use of injectable non-insulin antidiabetic drugs: Secondary | ICD-10-CM

## 2023-06-18 DIAGNOSIS — Z23 Encounter for immunization: Secondary | ICD-10-CM | POA: Insufficient documentation

## 2023-06-18 DIAGNOSIS — R001 Bradycardia, unspecified: Secondary | ICD-10-CM | POA: Diagnosis not present

## 2023-06-18 DIAGNOSIS — L989 Disorder of the skin and subcutaneous tissue, unspecified: Secondary | ICD-10-CM

## 2023-06-18 DIAGNOSIS — E1151 Type 2 diabetes mellitus with diabetic peripheral angiopathy without gangrene: Secondary | ICD-10-CM

## 2023-06-18 DIAGNOSIS — Z122 Encounter for screening for malignant neoplasm of respiratory organs: Secondary | ICD-10-CM

## 2023-06-18 LAB — POCT GLYCOSYLATED HEMOGLOBIN (HGB A1C): Hemoglobin A1C: 6.8 % — AB (ref 4.0–5.6)

## 2023-06-18 MED ORDER — ROSUVASTATIN CALCIUM 20 MG PO TABS: 20.0000 mg | ORAL_TABLET | Freq: Every day | ORAL | 3 refills | Status: DC

## 2023-06-18 MED ORDER — OZEMPIC (0.25 OR 0.5 MG/DOSE) 2 MG/3ML ~~LOC~~ SOPN: 0.2500 mg | PEN_INJECTOR | SUBCUTANEOUS | 3 refills | Status: DC

## 2023-06-18 NOTE — Progress Notes (Signed)
 Assessment/Plan:   Type 2 Diabetes Mellitus   Well-controlled with an A1c of 6.8. Currently on semaglutide (Ozempic) 0.25 mg with no side effects. Previous increase to 0.5 mg was associated with vertigo, possibly related to an ear condition. Patient prefers to wait until after ENT consultation before considering increasing the dose for potential weight loss benefits. Discussed benefits of weight loss for overall health and sleep apnea management.   - Continue semaglutide (Ozempic) 0.25 mg   - Follow up with ENT before considering dose increase    Sleep Apnea with Bradycardia Completed sleep study and awaiting CPAP machine. Noted low heart rate during sleep study, with a recent reading of 43 bpm on Fitbit. No chest pain or shortness of breath. Discussed importance of CPAP for reducing cardiac stress and improving sleep quality.   - Follow up with cardiology on March 18   - Encourage CPAP machine usage    Hypertension   Blood pressure today is 138/84, slightly higher than previous readings, possibly influenced by recent meal. Currently on olmesartan 5 mg daily. No symptoms of chest pain, shortness of breath, excessive thirst, or frequent urination. Discussed importance of home monitoring for better management.   - Continue olmesartan 5 mg daily   - Encourage home blood pressure monitoring    Hyperlipidemia   Cholesterol levels mildly elevated as of December. Currently on rosuvastatin 10 mg daily. Goal is to reduce LDL to under 70. Discussed increasing dose to 20 mg for better control.   - Increase rosuvastatin to 20 mg daily    Skin Lesions   New skin lesions on forehead and temple, possibly keratinous. Long-standing skin tags around eyes and neck. Discussed potential for skin cancer and need for biopsy.   - Refer to dermatologist for evaluation and possible biopsy of forehead and temple lesions    General Health Maintenance   Due for annual lung cancer screening and pneumonia  vaccination. Previous lung cancer screening in 2023 showed aortic atherosclerosis and emphysema but no cancer. Discussed importance of vaccination for preventing pneumonia complications in diabetics.   - Order lung cancer screening   - Discuss pneumonia vaccination; he to consider and decide later    Follow-up   - Follow up with gastroenterology on March 31   - Follow up with cardiology on March 18   - Follow up with ENT for vertigo and cochlear implants   - Schedule next diabetes check in three months.       Medications Discontinued During This Encounter  Medication Reason   clobetasol (TEMOVATE) 0.05 % external solution    triamcinolone cream (KENALOG) 0.1 %    polyethylene glycol powder (GLYCOLAX/MIRALAX) 17 GM/SCOOP powder    albuterol (VENTOLIN HFA) 108 (90 Base) MCG/ACT inhaler    promethazine-dextromethorphan (PROMETHAZINE-DM) 6.25-15 MG/5ML syrup    Semaglutide,0.25 or 0.5MG /DOS, (OZEMPIC, 0.25 OR 0.5 MG/DOSE,) 2 MG/3ML SOPN Reorder   rosuvastatin (CRESTOR) 10 MG tablet       Subjective:   Encounter date: 06/18/2023  Scott Shannon. is a 58 y.o. male who has Sensorineural hearing loss (SNHL) of both ears; Hyperlipidemia associated with type 2 diabetes mellitus (HCC); Primary osteoarthritis involving multiple joints; Primary insomnia; Snoring; Type 2 diabetes mellitus with hyperglycemia (HCC); Hyperhidrosis; Hypertension associated with diabetes (HCC); Psoriasis; History of tobacco use; Pulmonary emphysema (HCC); COVID-19; Class 1 obesity due to excess calories with serious comorbidity and body mass index (BMI) of 30.0 to 30.9 in adult; Apnea; Gastroesophageal reflux disease without esophagitis; Increased frequency of  urination; Loud snoring; GERD with apnea; Obesity (BMI 35.0-39.9 without comorbidity); DM (diabetes mellitus) type II, controlled, with peripheral vascular disorder (HCC); Incomplete right bundle branch block (RBBB) determined by electrocardiography; Dizziness;  Diverticulosis; Hematochezia; Constipation; Labyrinthitis; Uses cochlear implant; and BPPV (benign paroxysmal positional vertigo), bilateral on their problem list..   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 Will schedule eye exam. ) .   Discussed the use of AI scribe software for clinical note transcription with the patient, who gave verbal consent to proceed.  History of Present Illness   Scott Shannon. is a 58 year old male with diabetes and hypertension who presents for follow-up.  He is here for follow-up on his diabetes management. His diabetes is well-controlled with an A1c of 6.8. He is currently on semaglutide (Ozempic) at a dose of 0.25 mg, which he tolerates well without side effects after reducing from a higher dose that may have contributed to vertigo.  Regarding his hypertension, his blood pressure today is 138/84 mmHg, slightly higher than previous readings, which he attributes to having just eaten lunch. He is not currently monitoring his blood pressure at home as he does not have a machine. No chest pain, shortness of breath, excessive thirst, or frequent urination. He continues to take olmesartan 5 mg daily.  He mentions a low heart rate noted during a sleep study and by his Fitbit, with a reading of 43 bpm. He is scheduled to see a cardiologist on March 18th. He is not currently using a CPAP machine but is scheduled to  receive one soon.  He reports no recent episodes of vertigo and attributes previous episodes to either medication or an ear issue. He is awaiting further evaluation by an ENT specialist.  He has a history of skin lesions on his forehead and around his eyes, with the forehead lesions being more recent and causing discomfort when wearing a hat. He is awaiting a dermatology referral for further evaluation.  No recent rectal bleeding and his constipation issues have resolved. He has an upcoming appointment with a gastroenterologist on March 31st.  He is on rosuvastatin 10 mg daily for cholesterol management, with previous levels mildly elevated.      The 10-year ASCVD risk score (Arnett DK, et al., 2019) is: 13%   Values used to calculate the score:     Age: 75 years     Sex: Male     Is Non-Hispanic African American: No     Diabetic: Yes     Tobacco smoker: No     Systolic Blood Pressure: 138 mmHg     Is BP treated: No     HDL Cholesterol: 54.9 mg/dL     Total Cholesterol: 181 mg/dL  HPI:   Review of Systems  All other systems reviewed and are negative.   Past Surgical History:  Procedure Laterality Date   HAND SURGERY Bilateral    HEAD SUTURES     HIP REPLACEMENT Left    URINARY TRACT SURGERY     AS A CHILD    Outpatient Medications Prior  to Visit  Medication Sig Dispense Refill   esomeprazole (NEXIUM) 40 MG capsule Take 1 capsule 1 hour before breakfast in the morning 30 capsule 3   meclizine (ANTIVERT) 25 MG tablet Take 0.5-1 tablets (12.5-25 mg total) by mouth 3 (three) times daily as needed for dizziness or nausea. 30 tablet 0   meloxicam (MOBIC) 15 MG tablet Take 1 tablet (15 mg total) by mouth daily. 90 tablet 3   Multiple Vitamin (MULTIVITAMIN ADULT PO) Take by mouth daily. TAKE ONE DAILY     olmesartan (BENICAR) 5 MG tablet Take 2 tablets (10 mg total) by mouth daily. 180 tablet 3   rosuvastatin (CRESTOR) 10 MG tablet Take 1 tablet by mouth once daily 90 tablet 0    Semaglutide,0.25 or 0.5MG /DOS, (OZEMPIC, 0.25 OR 0.5 MG/DOSE,) 2 MG/3ML SOPN Inject 0.25 mg into the skin once a week. 9 mL 0   Blood Glucose Monitoring Suppl DEVI 1 each by Does not apply route in the morning, at noon, and at bedtime. May substitute to any manufacturer covered by patient's insurance. (Patient not taking: Reported on 06/18/2023) 1 each 0   albuterol (VENTOLIN HFA) 108 (90 Base) MCG/ACT inhaler Inhale 2 puffs into the lungs every 6 (six) hours as needed for wheezing or shortness of breath. (Patient not taking: Reported on 03/20/2023) 6.7 g 2   clobetasol (TEMOVATE) 0.05 % external solution Apply topically. (Patient not taking: Reported on 03/20/2023)     polyethylene glycol powder (GLYCOLAX/MIRALAX) 17 GM/SCOOP powder Take 17 g by mouth daily. (Patient not taking: Reported on 05/17/2023) 500 g 0   promethazine-dextromethorphan (PROMETHAZINE-DM) 6.25-15 MG/5ML syrup Take 5 mLs by mouth 4 (four) times daily as needed for cough. (Patient not taking: Reported on 06/18/2023) 118 mL 0   triamcinolone cream (KENALOG) 0.1 % Apply topically. (Patient not taking: Reported on 05/17/2023)     No facility-administered medications prior to visit.    Family History  Problem Relation Age of Onset   Colon polyps Mother    Colon polyps Father    Cancer Father    Colon cancer Neg Hx    Crohn's disease Neg Hx    Esophageal cancer Neg Hx    Rectal cancer Neg Hx    Stomach cancer Neg Hx     Social History   Socioeconomic History   Marital status: Married    Spouse name: Not on file   Number of children: Not on file   Years of education: Not on file   Highest education level: 12th grade  Occupational History   Not on file  Tobacco Use   Smoking status: Former    Current packs/day: 0.00    Average packs/day: 2.0 packs/day for 30.0 years (60.0 ttl pk-yrs)    Types: Cigarettes    Start date: 12    Quit date: 2022    Years since quitting: 3.1   Smokeless tobacco: Never  Vaping Use   Vaping  status: Never Used  Substance and Sexual Activity   Alcohol use: Yes    Comment: occ   Drug use: Never   Sexual activity: Yes  Other Topics Concern   Not on file  Social History Narrative   Not on file   Social Drivers of Health   Financial Resource Strain: Low Risk  (04/30/2023)   Overall Financial Resource Strain (CARDIA)    Difficulty of Paying Living Expenses: Not hard at all  Food Insecurity: No Food Insecurity (04/30/2023)   Hunger Vital Sign    Worried  About Running Out of Food in the Last Year: Never true    Ran Out of Food in the Last Year: Never true  Transportation Needs: No Transportation Needs (04/30/2023)   PRAPARE - Administrator, Civil Service (Medical): No    Lack of Transportation (Non-Medical): No  Physical Activity: Insufficiently Active (04/30/2023)   Exercise Vital Sign    Days of Exercise per Week: 4 days    Minutes of Exercise per Session: 20 min  Stress: No Stress Concern Present (04/30/2023)   [employer] of Occupational Health - Occupational Stress Questionnaire    Feeling of Stress : Not at all  Social Connections: Socially Isolated (04/30/2023)   Social Connection and Isolation Panel [NHANES]    Frequency of Communication with Friends and Family: Never    Frequency of Social Gatherings with Friends and Family: Never    Attends Religious Services: Never    Database administrator or Organizations: No    Attends Banker Meetings: Never    Marital Status: Married  Catering manager Violence: Not At Risk (11/13/2022)   Humiliation, Afraid, Rape, and Kick questionnaire    Fear of Current or Ex-Partner: No    Emotionally Abused: No    Physically Abused: No    Sexually Abused: No                                                                                                  Objective:  Physical Exam: BP 138/84   Pulse 75   Temp 97.6 F (36.4 C) (Temporal)   Wt 228 lb (103.4 kg)   SpO2 99%   BMI 31.80 kg/m   Wt  Readings from Last 3 Encounters:  06/18/23 228 lb (103.4 kg)  05/29/23 217 lb (98.4 kg)  05/16/23 178 lb 14.4 oz (81.1 kg)     Physical Exam Constitutional:      Appearance: Normal appearance.  HENT:     Head: Normocephalic and atraumatic.     Right Ear: Hearing normal.     Left Ear: Hearing normal.     Nose: Nose normal.  Eyes:     General: No scleral icterus.       Right eye: No discharge.        Left eye: No discharge.     Extraocular Movements: Extraocular movements intact.  Cardiovascular:     Rate and Rhythm: Normal rate and regular rhythm.     Heart sounds: Normal heart sounds.  Pulmonary:     Effort: Pulmonary effort is normal.     Breath sounds: Normal breath sounds.  Abdominal:     Palpations: Abdomen is soft.     Tenderness: There is no abdominal tenderness.  Skin:    General: Skin is warm.     Findings: Lesion present.       Neurological:     General: No focal deficit present.     Mental Status: He is alert.     Cranial Nerves: No cranial nerve deficit.  Psychiatric:        Mood and Affect: Mood normal.  Behavior: Behavior normal.        Thought Content: Thought content normal.        Judgment: Judgment normal.        Home sleep test Result Date: 05/29/2023 Melvyn Novas, MD     06/12/2023  9:33 AM   [name]. Male, 58 y.o., [birth year] MRN: [id number] Lowella Petties. [name]. (Legal Name)  HOME SLEEP TEST REPORT ( by Watch PAT)  STUDY DATE:  05-29-2023/ data load 05-30-2023  ORDERING CLINICIAN: Melvyn Novas, MD REFERRING CLINICIAN:  Dr Janee Morn, Cc: Dr Jacinto Halim  CLINICAL INFORMATION/HISTORY: Nathanie Ottley. is a 58 y.o. male patient who is seen upon referral on 04/24/2023 from Dr. Janee Morn  for a sleep Consultation. Chief concern according to patient :  "I woke up from choking sensation, attributed apnea to GERD, treated GERD and since then no longer happening. I am not sleepy, I sleep 4-6 hours each night. I have always  slept  about this long , I feel fine with this. "  Nocturia 1-2 times, used to be 3 times until 2 months ago, on Ozempic  for DM, on nexium, GERD- Sleeps slightly elevated too. He has oesophagitis and ulcers -esophageal or gastric ?   Obesity. DM2.  has been dysarthric, cochlear implant right side, nasal speech. Congenital hearing impairment. Reportedly, he gained weight on ozempic, is  loudly snoring, associated with supine sleep. He is edentulous and the dentures support his facial structures during the day , but not at night. Patient is working as a Civil Service fast streamer, and lives in a household with spouse / male partner - The patient  used to work in shifts( Chief Technology Officer,) as a Naval architect, [employer].  Tobacco use:  quit Thanksgiving 2022.  ETOH use : daily 1 drink with dinner ( 6-8 PM ) one mixed drink..  Caffeine intake in form of Coffee( none) Soda( 1 a day) Tea ( quit a moths ago -)  Epworth sleepiness score: 1/ 24 points  FSS endorsed at 28/ 63 points.  BMI:  31.7 kg/m  Neck Circumference: 19"   FINDINGS:  Sleep Summary:  Total Recording Time (hours, min):    7 hours 19 minutes  Total Sleep Time (hours, min):    5 hours 1 minutes           Percent REM (%):     14%    Sleep latency was 39 minutes long and REM sleep latency 104 minutes long.  There were 99 minutes of wakefulness after sleep onset.                             Respiratory Indices: I reported the AASM and CMS criteria scored AHI's.  This patient is covered under Medicare plan and the study would be interpreted by CMS guidelines.  Calculated pAHI (per hour):   30/h following AASM criteria.  By CMS criteria which are applied to Medicare patients this AHI is only 17.4/h and would no longer be considered severe but moderate.                        REM pAHI:   By AASM criteria 33.8/h and by CMS criteria 22.1/h.  NREM pAHI:     By CMS criteria 16.6/h                       Positional AHI:   The longest sleep  position was supine sleep 422 minutes with an CMS based AHI of 14/h, followed by right lateral sleep with an AHI of 7.8/h, prone sleep had an AHI of 4/h and left lateral sleep an AHI of 26.8/h.  So overall there is a benefit in avoiding supine sleep. Snoring level was reaching a mean volume of 43 dB.  It was present for about half of the total recorded sleep time.                                              Oxygen Saturation Statistics:  Oxygen Saturation (%) Mean:    The mean oxygen saturation was 93%          O2 Saturation Range (%):     Between a nadir at 86% with a maximum of 100%                                O2 Saturation (minutes) <89%:     0 minutes    Pulse Rate Statistics:  Pulse Mean (bpm):    53 bpm           Pulse Range:    Between bradycardia at 38 bpm and the maximum heart rate of 90 bpm.  Please note that the cardiac rhythm cannot be evaluated by this home sleep test device.           IMPRESSION:  This HST confirms the presence of extremely fragmented sleep, loud snoring, and a moderately- severe and all- obstructive form of sleep apnea. If scored by AASM criteria,  this constitutes severe OSA.   No central events were calculated by the device.  No significant clinical hypoxia was seen.  Intermittent bradycardia was frequently seen.  RECOMMENDATION: I would strongly recommend for the patient to use a positive airway pressure therapy device .  Due to the lack of biological teeth it would not be possible to apply  a dental device.  I also want this patient to  further advance his weight loss.  Reduce time in supine and alcohol intake ( it contributes to snoring )  The repeatedly very low heart rate should be evaluated by PCP/ cardiology. Auto CPAP 5-15 cm water, 2 cm epr and mask of choice.  INTERPRETING PHYSICIAN:  Melvyn Novas, MD [facility] Sleep Board certified by The [employer] of Sleep Medicine and Diplomate of the [employer] of Sleep Medicine.  Board certified In Neurology through the ABPN, Fellow of the [employer] of Neurology.     Recent Results (from the past 2160 hours)  CBC w/Diff     Status: None   Collection Time: 05/08/23 10:04 AM  Result Value Ref Range   WBC 7.7 4.0 - 10.5 K/uL   RBC 4.53 4.22 - 5.81 Mil/uL   Hemoglobin 14.2 13.0 - 17.0 g/dL   HCT 29.5 62.1 - 30.8 %   MCV 93.8 78.0 - 100.0 fl   MCHC 33.3 30.0 - 36.0 g/dL   RDW 65.7 84.6 - 96.2 %   Platelets 265.0 150.0 - 400.0 K/uL  Neutrophils Relative % 62.1 43.0 - 77.0 %   Lymphocytes Relative 26.8 12.0 - 46.0 %   Monocytes Relative 7.7 3.0 - 12.0 %   Eosinophils Relative 2.6 0.0 - 5.0 %   Basophils Relative 0.8 0.0 - 3.0 %   Neutro Abs 4.8 1.4 - 7.7 K/uL   Lymphs Abs 2.1 0.7 - 4.0 K/uL   Monocytes Absolute 0.6 0.1 - 1.0 K/uL   Eosinophils Absolute 0.2 0.0 - 0.7 K/uL   Basophils Absolute 0.1 0.0 - 0.1 K/uL  Basic Metabolic Panel (BMET)     Status: Abnormal   Collection Time: 05/08/23 10:04 AM  Result Value Ref Range   Sodium 138 135 - 145 mEq/L   Potassium 4.2 3.5 - 5.1 mEq/L   Chloride 103 96 - 112 mEq/L   CO2 28 19 - 32 mEq/L   Glucose, Bld 108 (H) 70 - 99 mg/dL   BUN 19 6 - 23 mg/dL   Creatinine, Ser 6.57 0.40 - 1.50 mg/dL   GFR 84.69 >62.95 mL/min    Comment: Calculated using the CKD-EPI Creatinine Equation (2021)   Calcium 9.9 8.4 - 10.5 mg/dL  POCT glycosylated hemoglobin (Hb A1C)     Status: Abnormal   Collection Time: 06/18/23  2:16 PM  Result Value Ref Range   Hemoglobin A1C 6.8 (A) 4.0 - 5.6 %   HbA1c POC (<> result, manual entry)     HbA1c, POC (prediabetic range)     HbA1c, POC (controlled diabetic range)          Garner Nash, MD, MS

## 2023-06-27 ENCOUNTER — Ambulatory Visit (HOSPITAL_BASED_OUTPATIENT_CLINIC_OR_DEPARTMENT_OTHER)
Admission: RE | Admit: 2023-06-27 | Discharge: 2023-06-27 | Disposition: A | Discharge status: Home / Self Care | Source: Ambulatory Visit | Attending: Family Medicine | Admitting: Family Medicine

## 2023-06-27 DIAGNOSIS — K5730 Diverticulosis of large intestine without perforation or abscess without bleeding: Secondary | ICD-10-CM | POA: Insufficient documentation

## 2023-06-27 DIAGNOSIS — I700 Atherosclerosis of aorta: Secondary | ICD-10-CM | POA: Insufficient documentation

## 2023-06-27 DIAGNOSIS — Z122 Encounter for screening for malignant neoplasm of respiratory organs: Secondary | ICD-10-CM | POA: Diagnosis not present

## 2023-06-27 DIAGNOSIS — I2510 Atherosclerotic heart disease of native coronary artery without angina pectoris: Secondary | ICD-10-CM | POA: Insufficient documentation

## 2023-06-27 DIAGNOSIS — Z87891 Personal history of nicotine dependence: Secondary | ICD-10-CM | POA: Diagnosis not present

## 2023-06-27 DIAGNOSIS — J439 Emphysema, unspecified: Secondary | ICD-10-CM | POA: Diagnosis not present

## 2023-06-29 DIAGNOSIS — G4733 Obstructive sleep apnea (adult) (pediatric): Secondary | ICD-10-CM | POA: Diagnosis not present

## 2023-07-03 ENCOUNTER — Ambulatory Visit: Payer: 59 | Attending: Cardiology | Admitting: Cardiology

## 2023-07-03 ENCOUNTER — Encounter: Payer: Self-pay | Admitting: Cardiology

## 2023-07-03 VITALS — BP 118/76 | HR 57 | Resp 16 | Ht 71.0 in | Wt 220.6 lb

## 2023-07-03 DIAGNOSIS — E782 Mixed hyperlipidemia: Secondary | ICD-10-CM | POA: Diagnosis not present

## 2023-07-03 DIAGNOSIS — R9439 Abnormal result of other cardiovascular function study: Secondary | ICD-10-CM | POA: Diagnosis not present

## 2023-07-03 DIAGNOSIS — I2510 Atherosclerotic heart disease of native coronary artery without angina pectoris: Secondary | ICD-10-CM

## 2023-07-03 DIAGNOSIS — E119 Type 2 diabetes mellitus without complications: Secondary | ICD-10-CM | POA: Diagnosis not present

## 2023-07-03 DIAGNOSIS — I10 Essential (primary) hypertension: Secondary | ICD-10-CM | POA: Diagnosis not present

## 2023-07-03 DIAGNOSIS — R001 Bradycardia, unspecified: Secondary | ICD-10-CM

## 2023-07-03 MED ORDER — EZETIMIBE 10 MG PO TABS: 10.0000 mg | ORAL_TABLET | Freq: Every day | ORAL | 3 refills | Status: DC

## 2023-07-03 NOTE — Progress Notes (Unsigned)
 Cardiology Office Note:  .   Date:  07/04/2023  ID:  Scott Shannon., DOB 1966-02-03, MRN [id number] PCP: Garnette Gunner, MD   HeartCare Providers Cardiologist:  Yates Decamp, MD   History of Present Illness: .   Scott Tibbs. is a 58 y.o. Caucasian male patient with hypertension, hypercholesterolemia, diabetes mellitus which is well-controlled, history of tobacco use disorder, OSA, recent diagnosis and recommended CPAP patient having some difficulty with use of CPAP, found to have coronary atherosclerosis and calcification in the RCA and LAD territory by recent CT scan, also during sleep study was found to have marked bradycardia.  In view of his multiple cardiovascular risks he was referred for further cardiac evaluation.  Discussed the use of AI scribe software for clinical note transcription with the patient, who gave verbal consent to proceed.  History of Present Illness   The patient, a 58 year old male with a history of hypertension, hyperlipidemia, and diabetes, was referred to cardiology due to a low heart rate detected during a sleep test. He has a family history of heart disease, with his mother having heart problems in her fifties and passing away from a massive heart attack and stroke in her late sixties. The patient quit smoking in 2022. He is currently on Crestor for hyperlipidemia, Homisartan for hypertension, and Ozempic for diabetes. However, he has been experiencing difficulty with the Ozempic, feeling sick and experiencing vertigo. He has high triglycerides and a high A1c level, indicating poor control of his diabetes.  The patient is overweight and has a poor diet, often eating fast food and not consuming enough fruits and vegetables. He also drinks artificially sweetened drinks. He does not have teeth, which he says limits his ability to eat certain foods. He is not physically active and does not use his CPAP machine regularly, which was prescribed for sleep  apnea. A CT scan of his chest showed plaque buildup in his heart, raising concerns about potential heart disease.      Labs   Lab Results  Component Value Date   CHOL 181 03/20/2023   HDL 54.90 03/20/2023   LDLCALC 87 03/20/2023   TRIG 195.0 (H) 03/20/2023   CHOLHDL 3 03/20/2023   Lab Results  Component Value Date   NA 138 05/08/2023   K 4.2 05/08/2023   CO2 28 05/08/2023   GLUCOSE 108 (H) 05/08/2023   BUN 19 05/08/2023   CREATININE 1.21 05/08/2023   CALCIUM 9.9 05/08/2023   GFR 66.11 05/08/2023      Latest Ref Rng & Units 05/08/2023   10:04 AM 03/20/2023    9:04 AM 06/02/2022    1:55 PM  BMP  Glucose 70 - 99 mg/dL 409  811  914   BUN 6 - 23 mg/dL 19  19  22    Creatinine 0.40 - 1.50 mg/dL 7.82  9.56  2.13   Sodium 135 - 145 mEq/L 138  136  138   Potassium 3.5 - 5.1 mEq/L 4.2  4.7  4.0   Chloride 96 - 112 mEq/L 103  100  103   CO2 19 - 32 mEq/L 28  28  27    Calcium 8.4 - 10.5 mg/dL 9.9  08.6  9.5       Latest Ref Rng & Units 05/08/2023   10:04 AM 06/02/2022    1:55 PM 09/14/2021   11:57 AM  CBC  WBC 4.0 - 10.5 K/uL 7.7  7.4  8.8   Hemoglobin 13.0 - 17.0  g/dL 82.9  56.2  13.0   Hematocrit 39.0 - 52.0 % 42.5  41.4  46.9   Platelets 150.0 - 400.0 K/uL 265.0  252.0  286.0    Lab Results  Component Value Date   HGBA1C 6.8 (A) 06/18/2023    Lab Results  Component Value Date   TSH 1.68 06/02/2022    Review of Systems  Cardiovascular:  Negative for chest pain, dyspnea on exertion and leg swelling.   Physical Exam:   VS:  BP 118/76 (BP Location: Left Arm, Patient Position: Sitting)   Pulse (!) 57   Resp 16   Ht 5\' 11"  (1.803 m)   Wt 220 lb 9.6 oz (100.1 kg)   SpO2 95%   BMI 30.77 kg/m    Wt Readings from Last 3 Encounters:  07/03/23 220 lb 9.6 oz (100.1 kg)  06/18/23 228 lb (103.4 kg)  05/29/23 217 lb (98.4 kg)    Physical Exam Constitutional:      Appearance: He is obese.  Neck:     Vascular: No carotid bruit or JVD.  Cardiovascular:     Rate and  Rhythm: Normal rate and regular rhythm.     Pulses: Intact distal pulses.     Heart sounds: Normal heart sounds. No murmur heard.    No gallop.  Pulmonary:     Effort: Pulmonary effort is normal.     Breath sounds: Normal breath sounds.  Abdominal:     General: Bowel sounds are normal.     Palpations: Abdomen is soft.  Musculoskeletal:     Right lower leg: No edema.     Left lower leg: No edema.    Studies Reviewed: Marland Kitchen    Low-dose CT scan chest for lung cancer screening 12/01/2021: Cardiovascular: Normal heart size.  LAD and RCA atherosclerosis.  Atherosclerotic nonaneurysmal thoracic aorta. Lungs and pleura: Mild centrilobular emphysema.  Left upper lobe pulmonary nodule measuring 5.6 mm, stable from 03/12/2014.  EKG:    EKG Interpretation Date/Time:  Tuesday July 03 2023 13:46:39 EDT Ventricular Rate:  49 PR Interval:  136 QRS Duration:  108 QT Interval:  462 QTC Calculation: 417 R Axis:   -15  Text Interpretation: EKG 07/03/2023: Sinus bradycardia at rate of 49 bpm, incomplete right bundle branch block.  Otherwise normal EKG. Confirmed by Delrae Rend [phone]) on 07/03/2023 1:58:58 PM    Medications and allergies    Allergies  Allergen Reactions   Amoxicillin Nausea Only   Metformin And Related Diarrhea     Current Outpatient Medications:    ezetimibe (ZETIA) 10 MG tablet, Take 1 tablet (10 mg total) by mouth daily., Disp: 90 tablet, Rfl: 3   Blood Glucose Monitoring Suppl DEVI, 1 each by Does not apply route in the morning, at noon, and at bedtime. May substitute to any manufacturer covered by patient's insurance. (Patient not taking: Reported on 06/18/2023), Disp: 1 each, Rfl: 0   esomeprazole (NEXIUM) 40 MG capsule, Take 1 capsule 1 hour before breakfast in the morning, Disp: 30 capsule, Rfl: 3   meclizine (ANTIVERT) 25 MG tablet, Take 0.5-1 tablets (12.5-25 mg total) by mouth 3 (three) times daily as needed for dizziness or nausea., Disp: 30 tablet, Rfl: 0    meloxicam (MOBIC) 15 MG tablet, Take 1 tablet (15 mg total) by mouth daily., Disp: 90 tablet, Rfl: 3   Multiple Vitamin (MULTIVITAMIN ADULT PO), Take by mouth daily. TAKE ONE DAILY, Disp: , Rfl:    olmesartan (BENICAR) 5 MG tablet, Take 2 tablets (  10 mg total) by mouth daily., Disp: 180 tablet, Rfl: 3   rosuvastatin (CRESTOR) 20 MG tablet, Take 1 tablet (20 mg total) by mouth daily., Disp: 90 tablet, Rfl: 3   Semaglutide,0.25 or 0.5MG /DOS, (OZEMPIC, 0.25 OR 0.5 MG/DOSE,) 2 MG/3ML SOPN, Inject 0.25 mg into the skin once a week., Disp: 9 mL, Rfl: 3   ASSESSMENT AND PLAN: .      ICD-10-CM   1. Coronary artery calcification seen on CAT scan  I25.10 EKG 12-Lead    ezetimibe (ZETIA) 10 MG tablet    Cardiac Stress Test: Informed Consent Details: Physician/Practitioner Attestation; Transcribe to consent form and obtain patient signature    EXERCISE TOLERANCE TEST (ETT)    2. Bradycardia by electrocardiogram  R00.1 Cardiac Stress Test: Informed Consent Details: Physician/Practitioner Attestation; Transcribe to consent form and obtain patient signature    EXERCISE TOLERANCE TEST (ETT)    3. Primary hypertension  I10 EKG 12-Lead    EXERCISE TOLERANCE TEST (ETT)    4. Mixed hyperlipidemia  E78.2 EKG 12-Lead    ezetimibe (ZETIA) 10 MG tablet    5. Type 2 diabetes mellitus without complication, without long-term current use of insulin (HCC)  E11.9 EKG 12-Lead     Assessment and Plan    Bradycardia   Low heart rate observed to drop below 43-44 bpm during a sleep test, likely due to past physical activity. No indication of blockage or severe cardiac issue.  He is completely asymptomatic.  However we will perform a routine treadmill exercise stress test to evaluate for heart rate response.  Coronary Artery Disease   A CT scan for lung cancer screening revealed coronary artery plaque. He is on Crestor 20 mg daily, but LDL is 87 mg/dL, above the target of <16 mg/dL. Triglycerides are elevated at 195  mg/dL, likely due to diet. Emphasized dietary changes to manage cholesterol and reduce cardiovascular risk. Discussed the need for additional Zetia to achieve target LDL. Add additional medication to lower LDL cholesterol to target levels, <70 in view of diabetes mellitus.  Continue Crestor 20 mg daily. Encourage dietary changes to reduce triglycerides and improve cardiovascular health.  Will request his PCP to check his labs on his next office visit in June.  Extensive discussion regarding diet with the patient and his wife. Will schedule for a routine treadmill exercise stress test, both to evaluate heart rate response and ischemic response, unless abnormal I will see him back on a as needed basis.  Hypertension   Blood pressure is well-controlled at 117/80 mmHg on Olmesartan 10 mg daily. Current management is effective. Continue Olmesartan 10 mg daily.  Type 2 Diabetes Mellitus   He is on Ozempic with an A1c of 6.8%. Reports vertigo and nausea, attributed to eating habits. Advised dietary modifications and slower eating to mitigate side effects and improve diabetes control. Advise to eat slowly and make healthier dietary choices to improve tolerance to Ozempic and enhance weight loss. Continue Ozempic as prescribed.  Obstructive Sleep Apnea   Recently started CPAP therapy but experiencing adjustment difficulties. Suggested alternative mask options for comfort. Encourage continued use of CPAP machine. Suggest trying a different CPAP mask style, such as a pillow mask, for improved comfort.  Emphysema   Smoking contributes to emphysema and occasional dyspnea. Emphasized weight management and exercise to alleviate symptoms. Encourage weight loss and regular exercise to improve respiratory function.  Follow-up   Scheduled to see family doctor on September 18, 2023, for further evaluation and management. Plan to perform  treadmill stress test to assess cardiac function. Follow up with family doctor on September 18, 2023. Perform treadmill stress test to assess cardiac function.         Signed,  Yates Decamp, MD, [facility] 07/04/2023, 10:21 PM [facility] [address] [place], [place] [address] Phone: [phone]. Fax:  [phone]

## 2023-07-03 NOTE — Patient Instructions (Addendum)
 Medication Instructions:  Your physician recommends that you continue on your current medications as directed. Please refer to the Current Medication list given to you today.  *If you need a refill on your cardiac medications before your next appointment, please call your pharmacy*   Lab Work: none If you have labs (blood work) drawn today and your tests are completely normal, you will receive your results only by: MyChart Message (if you have MyChart) OR A paper copy in the mail If you have any lab test that is abnormal or we need to change your treatment, we will call you to review the results.   Testing/Procedures: Your physician has requested that you have an exercise tolerance test. For further information please visit [email or web address] Please also follow instruction sheet, as given.    Follow-Up: At [facility], you and your health needs are our priority.  As part of our continuing mission to provide you with exceptional heart care, we have created designated Provider Care Teams.  These Care Teams include your primary Cardiologist (physician) and Advanced Practice Providers (APPs -  Physician Assistants and Nurse Practitioners) who all work together to provide you with the care you need, when you need it.  We recommend signing up for the patient portal called "MyChart".  Sign up information is provided on this After Visit Summary.  MyChart is used to connect with patients for Virtual Visits (Telemedicine).  Patients are able to view lab/test results, encounter notes, upcoming appointments, etc.  Non-urgent messages can be sent to your provider as well.   To learn more about what you can do with MyChart, go to [email or web address].    Your next appointment:   As needed  Provider:   Yates Decamp, MD     Other Instructions    Please arrive 15 minutes prior to your appointment time to allow for registration and insurance purposes.  The test will take approximately  45 minutes to complete.  How to prepare for your Exercise Stress Test: - Do bring a list of your current medications with you. If you do not take any of the medications listed below, you may take your medications as normal the day of the test. You may take all your normal medications the day of the test - DO wear comfortable clothes (no dresses or overalls) and walking shoes, tennis shoes preferred (no heels or open toed shoes allowed).

## 2023-07-12 DIAGNOSIS — H9193 Unspecified hearing loss, bilateral: Secondary | ICD-10-CM | POA: Diagnosis not present

## 2023-07-12 DIAGNOSIS — Z9621 Cochlear implant status: Secondary | ICD-10-CM | POA: Diagnosis not present

## 2023-07-12 DIAGNOSIS — H903 Sensorineural hearing loss, bilateral: Secondary | ICD-10-CM | POA: Diagnosis not present

## 2023-07-12 DIAGNOSIS — R42 Dizziness and giddiness: Secondary | ICD-10-CM | POA: Diagnosis not present

## 2023-07-16 ENCOUNTER — Ambulatory Visit (INDEPENDENT_AMBULATORY_CARE_PROVIDER_SITE_OTHER): Payer: 59 | Admitting: Gastroenterology

## 2023-07-16 ENCOUNTER — Encounter: Payer: Self-pay | Admitting: Gastroenterology

## 2023-07-16 VITALS — BP 122/72 | HR 60 | Ht 71.0 in | Wt 221.0 lb

## 2023-07-16 DIAGNOSIS — K921 Melena: Secondary | ICD-10-CM

## 2023-07-16 DIAGNOSIS — K219 Gastro-esophageal reflux disease without esophagitis: Secondary | ICD-10-CM

## 2023-07-16 NOTE — Patient Instructions (Signed)
 Follow Up as needed _______________________________________________________  If your blood pressure at your visit was 140/90 or greater, please contact your primary care physician to follow up on this.  _______________________________________________________  If you are age 59 or older, your body mass index should be between 23-30. Your Body mass index is 30.82 kg/m. If this is out of the aforementioned range listed, please consider follow up with your Primary Care Provider.  If you are age 10 or younger, your body mass index should be between 19-25. Your Body mass index is 30.82 kg/m. If this is out of the aformentioned range listed, please consider follow up with your Primary Care Provider.   ________________________________________________________  The [facility] providers would like to encourage you to use [facility] to communicate with providers for non-urgent requests or questions.  Due to long hold times on the telephone, sending your provider a message by [facility] may be a faster and more efficient way to get a response.  Please allow 48 business hours for a response.  Please remember that this is for non-urgent requests.  _______________________________________________________

## 2023-07-16 NOTE — Progress Notes (Signed)
 Chief Complaint: Hematochezia Primary GI MD: Dr. Tomasa Rand  HPI: 58 year old male with medical history as listed below presents for evaluation of hematochezia.  Seen by PCP in January for rectal bleeding characterized by blood in stool in toilet bowl.  He was referred to GI for further evaluation.  Patient states he has not seen blood since January and is doing well.  He states he has a previous history of gastric ulcers in which she had an endoscopy in 2010 in [place].  He was on long-term PPI which she stopped a few years ago.  He then started having recurrent heartburn for which his primary care put him on esomeprazole 40 Mg once daily.  Since being on esomeprazole 40 Mg once daily his symptoms have completely resolved and he is doing well.  He does take meloxicam on a daily basis for arthritic pain.  Denies breakthrough symptoms.  Denies melena/hematochezia.  Denies pain.  Overall he is doing well and has no complaints today.  Recent CBC without evidence of anemia and normal BMP  PREVIOUS GI WORKUP   Colonoscopy 10/2021 - One 3 mm polyp at the hepatic flexure, removed with a cold snare. Resected and retrieved.  - Multiple 3 to 4 mm polyps in the sigmoid colon, removed with a cold snare. Resected and retrieved.  - One 4 mm polyp at the recto- sigmoid colon, removed with a cold snare. Resected and retrieved.  - Mild diverticulosis in the sigmoid colon and in the descending colon. There was no evidence of diverticular bleeding.  - The examined portion of the ileum was normal.  - The distal rectum and anal verge are normal on retroflexion view. - repeat 7 years  Diagnosis 1. Surgical [P], colon, hepatic flexure, recto-sigmoid, polyp (2) - TUBULAR ADENOMAS (2), NEGATIVE FOR HIGH-GRADE DYSPLASIA. 2. Surgical [P], colon, sigmoid, polyp (3) - HYPERPLASTIC POLYPS (3).  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 90 tablet 3   meclizine (ANTIVERT) 25 MG tablet Take 0.5-1 tablets (12.5-25 mg total) by mouth 3 (three) times daily as needed for dizziness or nausea. 30 tablet 0   meloxicam (MOBIC) 15 MG tablet Take 1 tablet (15 mg total) by mouth daily. 90 tablet 3   Multiple Vitamin (MULTIVITAMIN ADULT PO) Take by mouth daily. TAKE ONE DAILY     olmesartan (BENICAR) 5 MG tablet Take 2 tablets (10 mg total) by mouth daily. 180 tablet 3   rosuvastatin (CRESTOR) 20  MG tablet Take 1 tablet (20 mg total) by mouth daily. 90 tablet 3   Blood Glucose Monitoring Suppl DEVI 1 each by Does not apply route in the morning, at noon, and at bedtime. May substitute to any manufacturer covered by patient's insurance. (Patient not taking: Reported on 07/16/2023) 1 each 0   Semaglutide,0.25 or 0.5MG /DOS, (OZEMPIC, 0.25 OR 0.5 MG/DOSE,) 2 MG/3ML SOPN Inject 0.25 mg into the skin once a week. (Patient not taking: Reported on 07/16/2023) 9 mL 3   No current facility-administered medications for this visit.    Allergies as of 07/16/2023 - Review Complete 07/16/2023  Allergen Reaction Noted    Amoxicillin Nausea Only 08/30/2021   Metformin and related Diarrhea 09/18/2022    Family History  Problem Relation Age of Onset   Colon polyps Mother    Colon polyps Father    Cancer Father    Colon cancer Neg Hx    Crohn's disease Neg Hx    Esophageal cancer Neg Hx    Rectal cancer Neg Hx    Stomach cancer Neg Hx     Social History   Socioeconomic History   Marital status: Married    Spouse name: Not on file   Number of children: Not on file   Years of education: Not on file   Highest education level: 12th grade  Occupational History   Occupation: delivery driver  Tobacco Use   Smoking status: Former    Current packs/day: 0.00    Average packs/day: 2.0 packs/day for 30.0 years (60.0 ttl pk-yrs)    Types: Cigarettes    Start date: 29    Quit date: 2022    Years since quitting: 3.2   Smokeless tobacco: Never  Vaping Use   Vaping status: Never Used  Substance and Sexual Activity   Alcohol use: Yes    Comment: occ   Drug use: Never   Sexual activity: Yes  Other Topics Concern   Not on file  Social History Narrative   Not on file   Social Drivers of Health   Financial Resource Strain: Low Risk  (04/30/2023)   Overall Financial Resource Strain (CARDIA)    Difficulty of Paying Living Expenses: Not hard at all  Food Insecurity: Low Risk  (07/12/2023)   Received from [facility]   Hunger Vital Sign    Worried About Running Out of Food in the Last Year: Never true    Ran Out of Food in the Last Year: Never true  Transportation Needs: No Transportation Needs (07/12/2023)   Received from [facility]    In the past 12 months, has lack of reliable transportation kept you from medical appointments, meetings, work or from getting things needed for daily living? : No  Physical Activity: Insufficiently Active (04/30/2023)   Exercise Vital Sign    Days of Exercise per Week: 4 days    Minutes of Exercise per Session: 20 min  Stress: No Stress  Concern Present (04/30/2023)   [employer] of Occupational Health - Occupational Stress Questionnaire    Feeling of Stress : Not at all  Social Connections: Socially Isolated (04/30/2023)   Social Connection and Isolation Panel [NHANES]    Frequency of Communication with Friends and Family: Never    Frequency of Social Gatherings with Friends and Family: Never    Attends Religious Services: Never    Database administrator or Organizations: No    Attends Banker Meetings: Never    Marital  Status: Married  Catering manager Violence: Not At Risk (11/13/2022)   Humiliation, Afraid, Rape, and Kick questionnaire    Fear of Current or Ex-Partner: No    Emotionally Abused: No    Physically Abused: No    Sexually Abused: No    Review of Systems:    Constitutional: No weight loss, fever, chills, weakness or fatigue HEENT: Eyes: No change in vision               Ears, Nose, Throat:  No change in hearing or congestion Skin: No rash or itching Cardiovascular: No chest pain, chest pressure or palpitations   Respiratory: No SOB or cough Gastrointestinal: See HPI and otherwise negative Genitourinary: No dysuria or change in urinary frequency Neurological: No headache, dizziness or syncope Musculoskeletal: No new muscle or joint pain Hematologic: No bleeding or bruising Psychiatric: No history of depression or anxiety    Physical Exam:  Vital signs: BP 122/72   Pulse 60   Ht 5\' 11"  (1.803 m)   Wt 221 lb (100.2 kg)   BMI 30.82 kg/m   Constitutional: NAD, Well developed, Well nourished, alert and cooperative Head:  Normocephalic and atraumatic. Eyes:   PEERL, EOMI. No icterus. Conjunctiva pink. Respiratory: Respirations even and unlabored. Lungs clear to auscultation bilaterally.   No wheezes, crackles, or rhonchi.  Cardiovascular:  Regular rate and rhythm. No peripheral edema, cyanosis or pallor.  Gastrointestinal:  Soft, nondistended, nontender. No rebound or guarding.  Normal bowel sounds. No appreciable masses or hepatomegaly. Rectal:  Not performed.  Msk:  Symmetrical without gross deformities. Without edema, no deformity or joint abnormality.  Neurologic:  Alert and  oriented x4;  grossly normal neurologically.  Skin:   Dry and intact without significant lesions or rashes. Psychiatric: Oriented to person, place and time. Demonstrates good judgement and reason without abnormal affect or behaviors.   RELEVANT LABS AND IMAGING: CBC    Component Value Date/Time   WBC 7.7 05/08/2023 1004   RBC 4.53 05/08/2023 1004   HGB 14.2 05/08/2023 1004   HCT 42.5 05/08/2023 1004   PLT 265.0 05/08/2023 1004   MCV 93.8 05/08/2023 1004   MCHC 33.3 05/08/2023 1004   RDW 13.1 05/08/2023 1004   LYMPHSABS 2.1 05/08/2023 1004   MONOABS 0.6 05/08/2023 1004   EOSABS 0.2 05/08/2023 1004   BASOSABS 0.1 05/08/2023 1004    CMP     Component Value Date/Time   NA 138 05/08/2023 1004   K 4.2 05/08/2023 1004   CL 103 05/08/2023 1004   CO2 28 05/08/2023 1004   GLUCOSE 108 (H) 05/08/2023 1004   BUN 19 05/08/2023 1004   CREATININE 1.21 05/08/2023 1004   CALCIUM 9.9 05/08/2023 1004   PROT 7.2 03/20/2023 0904   ALBUMIN 4.6 03/20/2023 0904   AST 18 03/20/2023 0904   ALT 29 03/20/2023 0904   ALKPHOS 90 03/20/2023 0904   BILITOT 0.4 03/20/2023 0904     Assessment/Plan:   Hematochezia Episode of hematochezia in January which is since resolved.  Up-to-date on colonoscopy which was done in 2023 with a recall of 7 years.  CBC without anemia.  Reassuringly patient is up-to-date on his colonoscopy.  Possibly hemorrhoidal bleed versus diverticular bleed that has since resolved.  No change in bowel habits or weight loss. - If recurrence of bleeding please let us know - Reassuring CBC and up-to-date colonoscopy  GERD Reported history of ulcers in 2010 on EGD.  GERD well-controlled on esomeprazole 40 Mg once daily with no breakthrough  symptoms.  Longstanding use of meloxicam.   Discussed with patient and wife risks of taking long-term NSAIDs and development of PUD.  They acknowledge understanding.  With no breakthrough symptoms and no melena there is no indication for EGD at this time. - Continue esomeprazole 40 Mg once daily - Recent kidney function was normal - If breakthrough symptoms please let us know - Try to avoid NSAIDs if possible - Educated patient on lifestyle modifications and provided patient education handout -- follow up PRN   This visit required 32 minutes of patient care (this includes precharting, chart review, review of results, face-to-face time used for counseling as well as treatment plan and follow-up. The patient was provided an opportunity to ask questions and all were answered. The patient agreed with the plan and demonstrated an understanding of the instructions.   Lara Mulch [facility] 07/16/2023, 11:27 AM  Cc: Garnette Gunner, MD

## 2023-07-24 ENCOUNTER — Encounter: Payer: Self-pay | Admitting: Family Medicine

## 2023-07-24 ENCOUNTER — Ambulatory Visit: Attending: Cardiology

## 2023-07-24 DIAGNOSIS — I10 Essential (primary) hypertension: Secondary | ICD-10-CM

## 2023-07-24 DIAGNOSIS — I2510 Atherosclerotic heart disease of native coronary artery without angina pectoris: Secondary | ICD-10-CM

## 2023-07-24 DIAGNOSIS — R001 Bradycardia, unspecified: Secondary | ICD-10-CM | POA: Diagnosis not present

## 2023-07-24 NOTE — Progress Notes (Signed)
 Agree with the assessment and plan as outlined by Boone Master, PA-C.

## 2023-07-25 ENCOUNTER — Encounter: Payer: Self-pay | Admitting: Cardiology

## 2023-07-25 ENCOUNTER — Encounter (HOSPITAL_COMMUNITY): Payer: Self-pay

## 2023-07-25 LAB — EXERCISE TOLERANCE TEST
Angina Index: 0
Base ST Depression (mm): 0 mm
Duke Treadmill Score: 7
Estimated workload: 8.7
Exercise duration (min): 7 min
Exercise duration (sec): 9 s
MPHR: 162 {beats}/min
Peak HR: 113 {beats}/min
Percent HR: 69 %
RPE: 16
Rest HR: 64 {beats}/min
ST Depression (mm): 0 mm

## 2023-07-25 NOTE — Addendum Note (Signed)
 Addended by: Delrae Rend on: 07/25/2023 08:32 AM   Modules accepted: Orders

## 2023-07-25 NOTE — Progress Notes (Signed)
 Indeterminate treadmill stress test will schedule for lexiscan nuclear stress to evaluate in view of very high risk patient features. Orders placed

## 2023-07-25 NOTE — Progress Notes (Signed)
(  I25.10) Coronary artery calcification seen on CAT scan Plan: EXERCISE TOLERANCE TEST (ETT)  (R00.1) Bradycardia by electrocardiogram Plan: EXERCISE TOLERANCE TEST (ETT)  (I10) Primary hypertension Plan: EXERCISE TOLERANCE TEST (ETT)

## 2023-07-26 ENCOUNTER — Encounter: Payer: Self-pay | Admitting: Family Medicine

## 2023-07-27 ENCOUNTER — Ambulatory Visit (HOSPITAL_COMMUNITY): Attending: Cardiology

## 2023-07-27 DIAGNOSIS — R9439 Abnormal result of other cardiovascular function study: Secondary | ICD-10-CM | POA: Diagnosis not present

## 2023-07-27 DIAGNOSIS — I2510 Atherosclerotic heart disease of native coronary artery without angina pectoris: Secondary | ICD-10-CM | POA: Insufficient documentation

## 2023-07-27 DIAGNOSIS — R001 Bradycardia, unspecified: Secondary | ICD-10-CM | POA: Diagnosis not present

## 2023-07-27 LAB — MYOCARDIAL PERFUSION IMAGING
LV dias vol: 79 mL (ref 62–150)
LV sys vol: 35 mL
Nuc Stress EF: 56 %
Peak HR: 89 {beats}/min
Rest HR: 48 {beats}/min
Rest Nuclear Isotope Dose: 10.5 mCi
SDS: 0
SRS: 0
SSS: 0
ST Depression (mm): 0 mm
Stress Nuclear Isotope Dose: 31 mCi
TID: 0.98

## 2023-07-27 MED ORDER — REGADENOSON 0.4 MG/5ML IV SOLN
0.4000 mg | Freq: Once | INTRAVENOUS | Status: AC
Start: 2023-07-27 — End: 2023-07-27
  Administered 2023-07-27: 0.4 mg via INTRAVENOUS

## 2023-07-27 MED ORDER — TECHNETIUM TC 99M TETROFOSMIN IV KIT
10.5000 | PACK | Freq: Once | INTRAVENOUS | Status: AC | PRN
  Administered 2023-07-27: 10.5 via INTRAVENOUS

## 2023-07-27 MED ORDER — TECHNETIUM TC 99M TETROFOSMIN IV KIT
31.0000 | PACK | Freq: Once | INTRAVENOUS | Status: AC | PRN
  Administered 2023-07-27: 31 via INTRAVENOUS

## 2023-07-28 ENCOUNTER — Encounter: Payer: Self-pay | Admitting: Cardiology

## 2023-07-28 NOTE — Progress Notes (Signed)
Normal exercise nuclear stress test

## 2023-08-10 ENCOUNTER — Other Ambulatory Visit: Payer: Self-pay | Admitting: Family Medicine

## 2023-08-10 DIAGNOSIS — K219 Gastro-esophageal reflux disease without esophagitis: Secondary | ICD-10-CM

## 2023-08-16 DIAGNOSIS — G4733 Obstructive sleep apnea (adult) (pediatric): Secondary | ICD-10-CM | POA: Diagnosis not present

## 2023-09-18 ENCOUNTER — Other Ambulatory Visit: Payer: Self-pay | Admitting: Family Medicine

## 2023-09-18 ENCOUNTER — Encounter: Payer: Self-pay | Admitting: Family Medicine

## 2023-09-18 ENCOUNTER — Ambulatory Visit: Admitting: Family Medicine

## 2023-09-18 VITALS — BP 138/78 | HR 50 | Temp 97.1°F | Resp 18 | Wt 221.4 lb

## 2023-09-18 DIAGNOSIS — Z7985 Long-term (current) use of injectable non-insulin antidiabetic drugs: Secondary | ICD-10-CM

## 2023-09-18 DIAGNOSIS — L304 Erythema intertrigo: Secondary | ICD-10-CM

## 2023-09-18 DIAGNOSIS — E1165 Type 2 diabetes mellitus with hyperglycemia: Secondary | ICD-10-CM | POA: Diagnosis not present

## 2023-09-18 DIAGNOSIS — G4733 Obstructive sleep apnea (adult) (pediatric): Secondary | ICD-10-CM | POA: Diagnosis not present

## 2023-09-18 DIAGNOSIS — K219 Gastro-esophageal reflux disease without esophagitis: Secondary | ICD-10-CM

## 2023-09-18 LAB — POCT GLYCOSYLATED HEMOGLOBIN (HGB A1C): Hemoglobin A1C: 6.9 % — AB (ref 4.0–5.6)

## 2023-09-18 MED ORDER — ESOMEPRAZOLE MAGNESIUM 40 MG PO CPDR: DELAYED_RELEASE_CAPSULE | ORAL | 3 refills | Status: DC

## 2023-09-18 NOTE — Progress Notes (Signed)
 Assessment & Plan   Assessment/Plan:     Assessment & Plan Type 2 Diabetes Mellitus Type 2 Diabetes Mellitus is well-controlled with lifestyle modifications. Hemoglobin A1c is 6.9, slightly increased from 6.8. He discontinued semaglutide  (Ozempic ) due to gastrointestinal side effects but maintains glucose levels through reduced sugar intake and increased physical activity. - Continue lifestyle modifications including diet and exercise - Monitor Hemoglobin A1c every 6 months  Gastroesophageal Reflux Disease (GERD) GERD is managed with omeprazole. He should avoid NSAIDs due to gastrointestinal bleeding, likely from diverticular disease. Gastroenterology recommends continuing omeprazole and monitoring kidney function. - Refill omeprazole prescription - Continue monitoring kidney function - Avoid NSAIDs  Sleep Apnea He experiences discomfort with CPAP machine air pressure settings, causing disrupted sleep and daytime sleepiness. He is considering Inspire therapy, to be discussed with the sleep medicine specialist. - Refer to sleep medicine specialist, Dr. Domeyer, for further evaluation and management of sleep apnea  Sensorineural Hearing Loss He has a malfunctioning cochlear implant on the right side and seeks evaluation for a potential new implant on the left side. He is scheduled for a consultation at [facility] and is open to surgery for improved hearing. - Consult with ENT for evaluation of cochlear implant replacement and potential new implant on the left side  Intertrigo He experiences intertrigo with chafing and rawness due to sweating, particularly in warmer weather. He is hesitant to use systemic anticholinergic medications. Current management includes powders and barrier protection. - Continue using powders and barrier protection for symptom management  General Health Maintenance He is due for an eye exam. - Schedule and complete an eye exam      Medications Discontinued  During This Encounter  Medication Reason   Semaglutide ,0.25 or 0.5MG /DOS, (OZEMPIC , 0.25 OR 0.5 MG/DOSE,) 2 MG/3ML SOPN    esomeprazole  (NEXIUM ) 40 MG capsule Reorder    Return in about 6 months (around 03/19/2024).        Subjective:   Encounter date: 09/18/2023  Scott Shannon. is a 58 y.o. male who has Sensorineural hearing loss (SNHL) of both ears; Hyperlipidemia associated with type 2 diabetes mellitus (HCC); Primary osteoarthritis involving multiple joints; Primary insomnia; Snoring; Type 2 diabetes mellitus with hyperglycemia (HCC); Hyperhidrosis; Intertrigo; Unknown skin lesion; Hypertension associated with diabetes (HCC); Psoriasis; History of tobacco use; Pulmonary emphysema (HCC); COVID-19; Class 1 obesity due to excess calories with serious comorbidity and body mass index (BMI) of 30.0 to 30.9 in adult; Apnea; Gastroesophageal reflux disease without esophagitis; Increased frequency of urination; Loud snoring; GERD with apnea; Obesity (BMI 35.0-39.9 without comorbidity); DM (diabetes mellitus) type II, controlled, with peripheral vascular disorder (HCC); 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 Pt is not fasting today. Pt voiced side effect with Ozempic  with abdominal discomfort; pt discontinued. //HM due- diabetic eye and foot exam ) and Insomnia (Pt c/o of CPAP machine, to put air and pressure unable to sleep) .  Discussed the use of AI scribe software for clinical note transcription with the patient, who gave verbal consent to proceed.  History of Present Illness Scott Shannon. is a 58 year old male with diabetes who presents for a follow-up visit.  He discontinued semaglutide  (Ozempic ) due to gastrointestinal side effects. His HbA1c is currently 6.9, slightly increased from 6.8. He is making lifestyle changes, including reducing sugar intake and exercising, and has switched to sugar-free Crystal Light instead of tea.  He is experiencing issues with sleep apnea. He was provided with a CPAP machine but finds it uncomfortable, as it disrupts his sleep by pushing air in when he is trying to breathe out, causing him to wake up feeling like he is choking. He has noticed increased daytime sleepiness since using the machine, which was not present before.  He has a history of heart issues and underwent a stress test in April. He is on ezetimibe  (Zetia ) for cholesterol management, taking it at night before bed. No current chest pain is reported.  He has a history of gastrointestinal issues and is taking omeprazole, with a supply lasting until the 15th. He had some bleeding previously, thought to be diverticular in nature. His mother also had diverticular disease.  He experiences swelling in his ankles, especially after wearing certain socks, which he attributes to being on his feet a lot. He also reports sweating and chafing in the groin area, which worsens with warm weather. He is hesitant to take medication for sweating due to concerns about stopping sweating completely.  He is scheduled for a cochlear implant consultation at [facility], hoping  to address issues with his current hearing aid and explore the possibility of a new implant on the left side. His current hearing aid on the right ear is malfunctioning, cutting off intermittently.     ROS  Past Surgical History:  Procedure Laterality Date   HAND SURGERY Bilateral    HEAD SUTURES     HIP REPLACEMENT Left    URINARY TRACT SURGERY     AS A CHILD    Outpatient Medications Prior to Visit  Medication Sig Dispense Refill   ezetimibe  (ZETIA ) 10 MG tablet Take 1 tablet (10 mg total) by mouth daily. 90 tablet 3   meclizine  (ANTIVERT ) 25 MG tablet Take 0.5-1 tablets (12.5-25 mg total) by mouth 3 (three) times daily as needed for dizziness or nausea. 30 tablet 0   meloxicam  (MOBIC ) 15 MG tablet Take 1 tablet (15 mg total) by mouth daily. 90 tablet 3   Multiple Vitamin (MULTIVITAMIN ADULT PO) Take by mouth daily. TAKE ONE DAILY     olmesartan  (BENICAR ) 5 MG tablet Take 2 tablets (10 mg total) by mouth daily. 180 tablet 3   rosuvastatin  (CRESTOR ) 20 MG tablet Take 1 tablet (20 mg total) by mouth daily. 90 tablet 3   ZARXIO 300 MCG/0.5ML SOSY injection      esomeprazole  (NEXIUM ) 40 MG capsule TAKE 1 CAPSULE BY MOUTH 1 HOUR BEFORE BREAKFAST IN THE MORNING 30 capsule 0   Blood Glucose Monitoring Suppl DEVI 1 each by Does not apply route in the morning, at noon, and at bedtime. May substitute to any manufacturer covered by patient's insurance. (Patient not taking: Reported on 09/18/2023) 1 each 0   Semaglutide ,0.25 or 0.5MG /DOS, (OZEMPIC , 0.25 OR 0.5 MG/DOSE,) 2 MG/3ML SOPN Inject 0.25 mg into the skin once a week. (Patient not taking: Reported on 09/18/2023) 9 mL 3   No facility-administered medications prior to visit.  Family History  Problem Relation Age of Onset   Colon polyps Mother    Colon polyps Father    Cancer Father    Colon cancer Neg Hx    Crohn's disease Neg Hx    Esophageal cancer Neg Hx    Rectal cancer Neg Hx    Stomach cancer Neg Hx     Social History    Socioeconomic History   Marital status: Married    Spouse name: Not on file   Number of children: Not on file   Years of education: Not on file   Highest education level: 12th grade  Occupational History   Occupation: delivery driver  Tobacco Use   Smoking status: Former    Current packs/day: 0.00    Average packs/day: 2.0 packs/day for 30.0 years (60.0 ttl pk-yrs)    Types: Cigarettes    Start date: 43    Quit date: 2022    Years since quitting: 3.4   Smokeless tobacco: Never  Vaping Use   Vaping status: Never Used  Substance and Sexual Activity   Alcohol use: Yes    Comment: occ   Drug use: Never   Sexual activity: Yes  Other Topics Concern   Not on file  Social History Narrative   Not on file   Social Drivers of Health   Financial Resource Strain: Low Risk  (04/30/2023)   Overall Financial Resource Strain (CARDIA)    Difficulty of Paying Living Expenses: Not hard at all  Food Insecurity: Low Risk  (07/12/2023)   Received from [facility]   Hunger Vital Sign    Worried About Running Out of Food in the Last Year: Never true    Ran Out of Food in the Last Year: Never true  Transportation Needs: No Transportation Needs (07/12/2023)   Received from [facility]    In the past 12 months, has lack of reliable transportation kept you from medical appointments, meetings, work or from getting things needed for daily living? : No  Physical Activity: Insufficiently Active (04/30/2023)   Exercise Vital Sign    Days of Exercise per Week: 4 days    Minutes of Exercise per Session: 20 min  Stress: No Stress Concern Present (04/30/2023)   [employer] of Occupational Health - Occupational Stress Questionnaire    Feeling of Stress : Not at all  Social Connections: Socially Isolated (04/30/2023)   Social Connection and Isolation Panel [NHANES]    Frequency of Communication with Friends and Family: Never    Frequency of Social Gatherings with Friends and  Family: Never    Attends Religious Services: Never    Database administrator or Organizations: No    Attends Banker Meetings: Never    Marital Status: Married  Catering manager Violence: Not At Risk (11/13/2022)   Humiliation, Afraid, Rape, and Kick questionnaire    Fear of Current or Ex-Partner: No    Emotionally Abused: No    Physically Abused: No    Sexually Abused: No  Objective:  Physical Exam: BP 138/78 (BP Location: Right Arm, Patient Position: Sitting, Cuff Size: Normal) Comment: recheck  Pulse (!) 50   Temp (!) 97.1 F (36.2 C) (Temporal)   Resp 18   Wt 221 lb 6.4 oz (100.4 kg)   SpO2 97%   BMI 30.88 kg/m   Wt Readings from Last 3 Encounters:  09/18/23 221 lb 6.4 oz (100.4 kg)  07/16/23 221 lb (100.2 kg)  07/03/23 220 lb 9.6 oz (100.1 kg)    Physical Exam GENERAL: Alert, cooperative, well developed, no acute distress. HEENT: Normocephalic, normal oropharynx, moist mucous membranes. CHEST: Clear to auscultation bilaterally, no wheezes, rhonchi, or crackles. CARDIOVASCULAR: Normal heart rate and rhythm, S1 and S2 normal without murmurs. ABDOMEN: Soft, non-tender, non-distended, without organomegaly, normal bowel sounds. EXTREMITIES: No cyanosis or edema, feet normal. NEUROLOGICAL: Cranial nerves grossly intact, moves all extremities without gross motor or sensory deficit. SKIN: Skin normal.     MYOCARDIAL PERFUSION IMAGING Result Date: 07/27/2023   The study is normal. The study is low risk.   No ST deviation was noted.   LV perfusion is normal. There is no evidence of ischemia. There is no evidence of infarction.   Left ventricular function is normal. End diastolic cavity size is normal. End systolic cavity size is normal.   Prior study not available for comparison.   Electronically signed by Luana Rumple, MD Low risk stress nuclear study with normal  perfusion and normal left ventricular regional and global systolic function.  EXERCISE TOLERANCE TEST (ETT) Result Date: 07/25/2023   Patient exercised for 7 minutes and 9 seconds on the Bruce protocol achieving maximum heart rate of 113 bpm which was 69% of maximum predicted value achieving 8.7 METS of activity.  Technically did not achieve target heart rate.   Blood pressure increased during exercise achieving a peak of 206/84   During standing, there was T wave inversion noted in lead V2 which remained throughout study.   There was no ST segment depression noted.  No adverse arrhythmias.  No ectopy.   Technically nondiagnostic exercise treadmill test due to failure to achieve target heart rate however there was no electrocardiographic evidence of ischemia during his exercise time.   Low Dose CT Chest w/o Contrast for Lung Cancer Screening [[id number]] Result Date: 07/23/2023 CLINICAL DATA:  58 year old male former smoker with 40 pack-year smoking history, quit smoking 2.5 years prior EXAM: CT CHEST WITHOUT CONTRAST LOW-DOSE FOR LUNG CANCER SCREENING TECHNIQUE: Multidetector CT imaging of the chest was performed following the standard protocol without IV contrast. RADIATION DOSE REDUCTION: This exam was performed according to the departmental dose-optimization program which includes automated exposure control, adjustment of the mA and/or kV according to patient size and/or use of iterative reconstruction technique. COMPARISON:  11/29/2021 screening chest CT FINDINGS: Substantially motion degraded scan, limiting assessment. Cardiovascular: Normal heart size. No significant pericardial effusion/thickening. Left anterior descending and right coronary atherosclerosis. Atherosclerotic nonaneurysmal thoracic aorta. Normal caliber pulmonary arteries. Mediastinum/Nodes: No significant thyroid  nodules. Unremarkable esophagus. No pathologically enlarged axillary, mediastinal or hilar lymph nodes, noting limited sensitivity  for the detection of hilar adenopathy on this noncontrast study. Lungs/Pleura: No pneumothorax. No pleural effusion. Mild centrilobular emphysema with diffuse bronchial wall thickening. No acute consolidative airspace disease or lung masses. Stable 6 mm posterior left upper lobe pulmonary nodule along the major fissure (series 302/image 72). No new significant pulmonary nodules. Upper abdomen: Mild left colonic diverticulosis. Musculoskeletal: No aggressive appearing focal osseous lesions. Mild thoracic spondylosis. IMPRESSION: 1. Substantially motion  degraded scan, limiting assessment. 2. Lung-RADS 2, benign appearance or behavior. Continue annual screening with low-dose chest CT without contrast in 12 months. 3. Two-vessel coronary atherosclerosis. 4. Mild left colonic diverticulosis. 5. Aortic Atherosclerosis (ICD10-I70.0) and Emphysema (ICD10-J43.9). Electronically Signed   By: Levell Reach M.D.   On: 07/23/2023 18:12    Recent Results (from the past 2160 hours)  EXERCISE TOLERANCE TEST (ETT)     Status: None   Collection Time: 07/24/23  2:15 PM  Result Value Ref Range   Angina Index 0    Rest HR 64.0 bpm   Rest BP 129/77 mmHg   Exercise duration (min) 7 min   Exercise duration (sec) 9 sec   Estimated workload 8.7    Peak HR 113 bpm   Peak BP 206/84 mmHg   MPHR 162 bpm   Percent HR 69.0 %   RPE 16.0    Base ST Depression (mm) 0 mm   Duke Treadmill Score 7    ST Depression (mm) 0 mm  MYOCARDIAL PERFUSION IMAGING     Status: None   Collection Time: 07/27/23  4:11 PM  Result Value Ref Range   Rest Nuclear Isotope Dose 10.5 mCi   Stress Nuclear Isotope Dose 31.0 mCi   Rest HR 48.0 bpm   Rest BP 116/69 mmHg   Peak HR 89 bpm   Peak BP 142/79 mmHg   SSS 0.0    SRS 0.0    SDS 0.0    TID 0.98    LV sys vol 35.0 mL   LV dias vol 79.0 62 - 150 mL   Nuc Stress EF 56 %   ST Depression (mm) 0 mm  POCT glycosylated hemoglobin (Hb A1C)     Status: Abnormal   Collection Time: 09/18/23  1:08  PM  Result Value Ref Range   Hemoglobin A1C 6.9 (A) 4.0 - 5.6 %   HbA1c POC (<> result, manual entry)     HbA1c, POC (prediabetic range)     HbA1c, POC (controlled diabetic range)          Carnell Christian, MD, MS

## 2023-09-19 DIAGNOSIS — Z45321 Encounter for adjustment and management of cochlear device: Secondary | ICD-10-CM | POA: Diagnosis not present

## 2023-09-19 DIAGNOSIS — Z87891 Personal history of nicotine dependence: Secondary | ICD-10-CM | POA: Diagnosis not present

## 2023-09-19 DIAGNOSIS — H903 Sensorineural hearing loss, bilateral: Secondary | ICD-10-CM | POA: Diagnosis not present

## 2023-09-19 DIAGNOSIS — Z9621 Cochlear implant status: Secondary | ICD-10-CM | POA: Diagnosis not present

## 2023-09-19 DIAGNOSIS — Z880 Allergy status to penicillin: Secondary | ICD-10-CM | POA: Diagnosis not present

## 2023-09-29 DIAGNOSIS — G4733 Obstructive sleep apnea (adult) (pediatric): Secondary | ICD-10-CM | POA: Diagnosis not present

## 2023-10-03 ENCOUNTER — Other Ambulatory Visit: Payer: Self-pay | Admitting: Family Medicine

## 2023-10-03 DIAGNOSIS — K219 Gastro-esophageal reflux disease without esophagitis: Secondary | ICD-10-CM

## 2023-10-17 ENCOUNTER — Other Ambulatory Visit: Payer: Self-pay | Admitting: Family Medicine

## 2023-10-17 DIAGNOSIS — K219 Gastro-esophageal reflux disease without esophagitis: Secondary | ICD-10-CM

## 2023-10-18 ENCOUNTER — Other Ambulatory Visit: Payer: Self-pay | Admitting: Family Medicine

## 2023-10-18 DIAGNOSIS — K219 Gastro-esophageal reflux disease without esophagitis: Secondary | ICD-10-CM

## 2023-10-26 ENCOUNTER — Ambulatory Visit (INDEPENDENT_AMBULATORY_CARE_PROVIDER_SITE_OTHER)

## 2023-10-26 DIAGNOSIS — Z0000 Encounter for general adult medical examination without abnormal findings: Secondary | ICD-10-CM | POA: Diagnosis not present

## 2023-10-26 NOTE — Patient Instructions (Signed)
 Mr. Scott Shannon , Thank you for taking time out of your busy schedule to complete your Annual Wellness Visit with me. I enjoyed our conversation and look forward to speaking with you again next year. I, as well as your care team,  appreciate your ongoing commitment to your health goals. Please review the following plan we discussed and let me know if I can assist you in the future. Your Game plan/ To Do List    Referrals: If you haven't heard from the office you've been referred to, please reach out to them at the phone provided.  N/a Follow up Visits: Next Medicare AWV with our clinical staff: 11/03/2024 at 4:20   Have you seen your provider in the last 6 months (3 months if uncontrolled diabetes)? Yes Next Office Visit with your provider: 03/20/2024 at 9:00  Clinician Recommendations:  Aim for 30 minutes of exercise or brisk walking, 6-8 glasses of water, and 5 servings of fruits and vegetables each day.       This is a list of the screening recommended for you and due dates:  Health Maintenance  Topic Date Due   Yearly kidney health urinalysis for diabetes  Never done   Hepatitis B Vaccine (1 of 3 - 19+ 3-dose series) Never done   COVID-19 Vaccine (3 - Pfizer risk series) 01/15/2020   Eye exam for diabetics  04/14/2023   Complete foot exam   09/18/2023   Pneumococcal Vaccination (1 of 2 - PCV) 06/17/2024*   Flu Shot  11/16/2023   Hemoglobin A1C  03/19/2024   Yearly kidney function blood test for diabetes  05/07/2024   Screening for Lung Cancer  06/26/2024   Medicare Annual Wellness Visit  10/25/2024   Colon Cancer Screening  11/14/2028   Hepatitis C Screening  Completed   HIV Screening  Completed   HPV Vaccine  Aged Out   Meningitis B Vaccine  Aged Out   DTaP/Tdap/Td vaccine  Discontinued   Zoster (Shingles) Vaccine  Discontinued  *Topic was postponed. The date shown is not the original due date.    Advanced directives: (Copy Requested) Please bring a copy of your health care power  of attorney and living will to the office to be added to your chart at your convenience. You can mail to [facility] [address] [place], [place] [address] or email to [email or web address] Advance Care Planning is important because it:  [x]  Makes sure you receive the medical care that is consistent with your values, goals, and preferences  [x]  It provides guidance to your family and loved ones and reduces their decisional burden about whether or not they are making the right decisions based on your wishes.  Follow the link provided in your after visit summary or read over the paperwork we have mailed to you to help you started getting your Advance Directives in place. If you need assistance in completing these, please reach out to us  so that we can help you!  See attachments for Preventive Care and Fall Prevention Tips.
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
## 2023-11-01 ENCOUNTER — Other Ambulatory Visit: Payer: Self-pay | Admitting: Family Medicine

## 2023-11-01 DIAGNOSIS — K219 Gastro-esophageal reflux disease without esophagitis: Secondary | ICD-10-CM

## 2023-11-14 DIAGNOSIS — H903 Sensorineural hearing loss, bilateral: Secondary | ICD-10-CM | POA: Diagnosis not present

## 2023-11-28 ENCOUNTER — Other Ambulatory Visit: Payer: Self-pay | Admitting: Family Medicine

## 2023-11-28 DIAGNOSIS — K219 Gastro-esophageal reflux disease without esophagitis: Secondary | ICD-10-CM

## 2023-12-25 ENCOUNTER — Ambulatory Visit: Admitting: Physician Assistant

## 2024-01-22 ENCOUNTER — Telehealth: Payer: Self-pay | Admitting: Family Medicine

## 2024-01-22 NOTE — Telephone Encounter (Signed)
 Spoke to patient and relay message below. Pt verbalized understanding and stated he will return CPAP machine to sleep study clinic.

## 2024-01-22 NOTE — Telephone Encounter (Signed)
 Pt says they want his pcp to write a prescription in other for them to be able to turn down his Cpap machine.

## 2024-03-04 NOTE — Progress Notes (Signed)
 Scott K Suppes Jr.                                          MRN: [id number]   03/04/2024   The [facility] Quality Team Specialist reviewed this patient medical record for the purposes of chart review for care gap closure. The following were reviewed: abstraction for care gap closure-glycemic status assessment.    [facility] Quality Team

## 2024-03-04 NOTE — Progress Notes (Signed)
 Scott K Mandich Jr.                                          MRN: [id number]   03/04/2024   The [facility] Quality Team Specialist reviewed this patient medical record for the purposes of chart review for care gap closure. The following were reviewed: chart review for care gap closure-diabetic eye exam and kidney health evaluation for diabetes:eGFR  and uACR.    [facility] Quality Team

## 2024-03-20 ENCOUNTER — Ambulatory Visit: Admitting: Family Medicine

## 2024-04-07 NOTE — Progress Notes (Signed)
 Scott K Nathanson Jr.                                          MRN: [id number]   04/07/2024   The [facility] Quality Team Specialist reviewed this patient medical record for the purposes of chart review for care gap closure. The following were reviewed: chart review for care gap closure-diabetic eye exam and kidney health evaluation for diabetes:eGFR  and uACR.    [facility] Quality Team

## 2024-04-08 ENCOUNTER — Ambulatory Visit: Admitting: Physician Assistant

## 2024-04-29 ENCOUNTER — Ambulatory Visit: Admitting: Family Medicine

## 2024-04-29 ENCOUNTER — Ambulatory Visit: Payer: Self-pay | Admitting: Family Medicine

## 2024-04-29 VITALS — BP 123/82 | HR 64 | Temp 97.1°F | Resp 16 | Ht 71.0 in | Wt 225.0 lb

## 2024-04-29 DIAGNOSIS — K219 Gastro-esophageal reflux disease without esophagitis: Secondary | ICD-10-CM

## 2024-04-29 DIAGNOSIS — E6609 Other obesity due to excess calories: Secondary | ICD-10-CM

## 2024-04-29 DIAGNOSIS — I152 Hypertension secondary to endocrine disorders: Secondary | ICD-10-CM | POA: Diagnosis not present

## 2024-04-29 DIAGNOSIS — M722 Plantar fascial fibromatosis: Secondary | ICD-10-CM | POA: Diagnosis not present

## 2024-04-29 DIAGNOSIS — N182 Chronic kidney disease, stage 2 (mild): Secondary | ICD-10-CM

## 2024-04-29 DIAGNOSIS — E66811 Obesity, class 1: Secondary | ICD-10-CM

## 2024-04-29 DIAGNOSIS — Z0000 Encounter for general adult medical examination without abnormal findings: Secondary | ICD-10-CM

## 2024-04-29 DIAGNOSIS — E785 Hyperlipidemia, unspecified: Secondary | ICD-10-CM | POA: Diagnosis not present

## 2024-04-29 DIAGNOSIS — M1A0710 Idiopathic chronic gout, right ankle and foot, without tophus (tophi): Secondary | ICD-10-CM | POA: Diagnosis not present

## 2024-04-29 DIAGNOSIS — E1169 Type 2 diabetes mellitus with other specified complication: Secondary | ICD-10-CM

## 2024-04-29 DIAGNOSIS — Z125 Encounter for screening for malignant neoplasm of prostate: Secondary | ICD-10-CM

## 2024-04-29 DIAGNOSIS — E1165 Type 2 diabetes mellitus with hyperglycemia: Secondary | ICD-10-CM | POA: Diagnosis not present

## 2024-04-29 DIAGNOSIS — J439 Emphysema, unspecified: Secondary | ICD-10-CM

## 2024-04-29 DIAGNOSIS — E1122 Type 2 diabetes mellitus with diabetic chronic kidney disease: Secondary | ICD-10-CM | POA: Diagnosis not present

## 2024-04-29 DIAGNOSIS — I2510 Atherosclerotic heart disease of native coronary artery without angina pectoris: Secondary | ICD-10-CM

## 2024-04-29 DIAGNOSIS — E1159 Type 2 diabetes mellitus with other circulatory complications: Secondary | ICD-10-CM | POA: Diagnosis not present

## 2024-04-29 LAB — LIPID PANEL
Cholesterol: 157 mg/dL (ref 28–200)
HDL: 56.9 mg/dL
LDL Cholesterol: 62 mg/dL (ref 10–99)
NonHDL: 99.9
Total CHOL/HDL Ratio: 3
Triglycerides: 190 mg/dL — ABNORMAL HIGH (ref 10.0–149.0)
VLDL: 38 mg/dL (ref 0.0–40.0)

## 2024-04-29 LAB — MICROALBUMIN / CREATININE URINE RATIO
Creatinine,U: 83.2 mg/dL
Microalb Creat Ratio: UNDETERMINED mg/g (ref 0.0–30.0)
Microalb, Ur: <0.7 mg/dL

## 2024-04-29 LAB — COMPREHENSIVE METABOLIC PANEL WITH GFR
ALT: 25 U/L (ref 3–53)
AST: 16 U/L (ref 5–37)
Albumin: 4.3 g/dL (ref 3.5–5.2)
Alkaline Phosphatase: 82 U/L (ref 39–117)
BUN: 20 mg/dL (ref 6–23)
CO2: 28 meq/L (ref 19–32)
Calcium: 9.4 mg/dL (ref 8.4–10.5)
Chloride: 101 meq/L (ref 96–112)
Creatinine, Ser: 1.19 mg/dL (ref 0.40–1.50)
GFR: 66.99 mL/min
Glucose, Bld: 150 mg/dL — ABNORMAL HIGH (ref 70–99)
Potassium: 4.9 meq/L (ref 3.5–5.1)
Sodium: 137 meq/L (ref 135–145)
Total Bilirubin: 0.5 mg/dL (ref 0.2–1.2)
Total Protein: 6.8 g/dL (ref 6.0–8.3)

## 2024-04-29 LAB — CBC WITH DIFFERENTIAL/PLATELET
Basophils Absolute: 0 K/uL (ref 0.0–0.1)
Basophils Relative: 0.6 % (ref 0.0–3.0)
Eosinophils Absolute: 0.1 K/uL (ref 0.0–0.7)
Eosinophils Relative: 1.8 % (ref 0.0–5.0)
HCT: 40.9 % (ref 39.0–52.0)
Hemoglobin: 13.9 g/dL (ref 13.0–17.0)
Lymphocytes Relative: 27 % (ref 12.0–46.0)
Lymphs Abs: 2.2 K/uL (ref 0.7–4.0)
MCHC: 33.9 g/dL (ref 30.0–36.0)
MCV: 91.4 fl (ref 78.0–100.0)
Monocytes Absolute: 0.6 K/uL (ref 0.1–1.0)
Monocytes Relative: 7.4 % (ref 3.0–12.0)
Neutro Abs: 5.2 K/uL (ref 1.4–7.7)
Neutrophils Relative %: 63.2 % (ref 43.0–77.0)
Platelets: 262 K/uL (ref 150.0–400.0)
RBC: 4.48 Mil/uL (ref 4.22–5.81)
RDW: 14 % (ref 11.5–15.5)
WBC: 8.2 K/uL (ref 4.0–10.5)

## 2024-04-29 LAB — PSA: PSA: 6.05 ng/mL — ABNORMAL HIGH (ref 0.10–4.00)

## 2024-04-29 LAB — TSH: TSH: 2.22 u[IU]/mL (ref 0.35–5.50)

## 2024-04-29 LAB — URIC ACID: Uric Acid, Serum: 7.7 mg/dL (ref 4.0–7.8)

## 2024-04-29 LAB — HEMOGLOBIN A1C: Hgb A1c MFr Bld: 8 % — ABNORMAL HIGH (ref 4.6–6.5)

## 2024-04-29 MED ORDER — ASPIRIN 81 MG PO TBEC: 81.0000 mg | DELAYED_RELEASE_TABLET | Freq: Every day | ORAL | 3 refills | Status: AC

## 2024-04-29 MED ORDER — OLMESARTAN MEDOXOMIL 5 MG PO TABS: 10.0000 mg | ORAL_TABLET | Freq: Every day | ORAL | 3 refills | Status: AC

## 2024-04-29 MED ORDER — ROSUVASTATIN CALCIUM 40 MG PO TABS: 40.0000 mg | ORAL_TABLET | Freq: Every day | ORAL | 3 refills | Status: AC

## 2024-04-29 MED ORDER — EZETIMIBE 10 MG PO TABS: 10.0000 mg | ORAL_TABLET | Freq: Every day | ORAL | 3 refills | Status: AC

## 2024-04-29 MED ORDER — ROSUVASTATIN CALCIUM 20 MG PO TABS: 20.0000 mg | ORAL_TABLET | Freq: Every day | ORAL | 3 refills | Status: DC

## 2024-04-29 MED ORDER — OLMESARTAN MEDOXOMIL 5 MG PO TABS: 10.0000 mg | ORAL_TABLET | Freq: Every day | ORAL | 3 refills | Status: DC

## 2024-04-29 MED ORDER — DAPAGLIFLOZIN PROPANEDIOL 10 MG PO TABS: 10.0000 mg | ORAL_TABLET | Freq: Every day | ORAL | 3 refills | Status: AC

## 2024-04-29 NOTE — Progress Notes (Signed)
 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 Last A1c was 6.9. No longer tolerating Ozempic  or metformin  due to side effects. Recent right foot swelling resolved with prednisone  and anti-inflammatory medication, possibly gout-related. Mild plantar fasciitis likely due to increased walking. Sensation intact and blood flow good in feet. - Will recheck hemoglobin A1c - Will screen for anemia with CBC - Will perform foot exam - Will discuss potential referral to podiatry for diabetic foot care - Provided stretches for plantar fasciitis - Recommended good insoles for walking - On olmesartan  for renal protection  Atherosclerotic heart disease of native coronary artery Coronary artery calcification seen on CT scan. Discussed potential antiplatelet therapy such as aspirin . - Recommend starting Aspirin  81 mg  Hypertension Managed with olmesartan  10 mg daily. Blood pressure today was 123/82 mmHg. - Continue olmesartan  10 mg daily  Hyperlipidemia Managed with rosuvastatin  20 mg daily and Zetia  10 mg daily. - Will check lipid panel  Pulmonary emphysema History of smoking. No longer smoking. Emphysema seen on prior scans. No shortness of breath - Monitor respiratory status  Class 1 obesity with comorbidity Class 1 obesity with BMI of 31. Comorbidities include hypertension and hyperlipidemia. Increased physical activity due to new job role. - Recommended healthy lifestyle, Mediterranean style diet, and cardiovascular activity - Advised low carb diet to manage diabetes and hyperlipidemia  Idiopathic chronic gout, right ankle and foot Recent right foot swelling resolved with prednisone  and anti-inflammatory medication. Possible gout-related. History of gout in right big toe. - Will check uric acid levels - Will consider medication to prevent gout recurrence if uric acid is  high  Gastroesophageal reflux disease GERD managed with OTC omeprazole 20 mg daily PRN. No heartburn reported. - Continue OTC omeprazole 20 mg daily PRN  Onychomycosis Yellowing and thickening of nails, likely mild nail fungus. Not bothersome to him. - Discussed potential treatment options for nail fungus  Plantar fasciitis Mild plantar fasciitis likely due to increased walking. Sharp pain along the bottom of the foot. - Provided stretches for plantar fasciitis - Recommended good insoles for walking  General Health Maintenance Due for renal evaluation and prostate cancer screening. - Ordered complete metabolic panel for renal evaluation - Screen for malignant neoplasm of the prostate       Medications Discontinued During This Encounter  Medication Reason   olmesartan  (BENICAR ) 5 MG tablet Reorder   rosuvastatin  (CRESTOR ) 20 MG tablet Reorder   ezetimibe  (ZETIA ) 10 MG tablet Reorder   esomeprazole  (NEXIUM ) 40 MG capsule    meclizine  (ANTIVERT ) 25 MG tablet    ZARXIO 300 MCG/0.5ML SOSY injection    olmesartan  (BENICAR ) 5 MG tablet     Patient Counseling(The following topics were reviewed and/or handout was given):  -Nutrition: Stressed importance of moderation in sodium/caffeine intake, saturated fat and cholesterol, caloric balance, sufficient intake of fresh fruits, vegetables, and fiber.  -Stressed the importance of regular exercise.   -Substance Abuse: Discussed cessation/primary prevention of tobacco, alcohol, or other drug use; driving or other dangerous activities under the influence; availability of treatment for abuse.   -Injury prevention: Discussed safety belts, safety helmets, smoke detector, smoking near bedding or upholstery.   -Sexuality: Discussed sexually transmitted diseases, partner selection, use of condoms, avoidance of unintended pregnancy and contraceptive alternatives.   -Dental health: Discussed importance of regular tooth brushing, flossing, and  dental visits.  -Health maintenance and immunizations reviewed. Please refer to  Health maintenance section.  Return in about 6 months (around 10/27/2024) for DM, BP.        Subjective:   Encounter date: 04/29/2024  Chief Complaint  Patient presents with   Medical Management of Chronic Issues    Patient presents today for a 7 month follow-up. Last visit was on 09/18/23. He reports no concerns at the moment. Pt declined vaccines at the moment. He reports last eye exam was in 2025 and request was send to eye doctor.    Discussed the use of AI scribe software for clinical note transcription with the patient, who gave verbal consent to proceed.  History of Present Illness Scott Shannon. is a 59 year old male with type 2 diabetes, hypertension, and hyperlipidemia who presents for management of his chronic conditions and recent right foot swelling.  Right foot swelling and pain - Significant right foot swelling occurred last week, causing pain with ambulation. - Swelling resolved after treatment with prednisone  and anti-inflammatory medication at an emergency clinic. - History of gout affecting the right big toe; suspects recent episode may be gout-related. - Occasional sharp pains along the plantar aspect of the right foot.  Type 2 diabetes mellitus - Managed with dietary modifications. - Last hemoglobin A1c was 6.9%. - Intolerant of Ozempic  and metformin  due to side effects. - Due for renal evaluation and hemoglobin A1c recheck.  Hypertension - Managed with olmesartan  10 mg daily. - Blood pressure today is 123/82 mmHg.  Hyperlipidemia and coronary artery calcification - Managed with rosuvastatin  20 mg daily and Zetia  10 mg daily. - History of coronary artery calcification identified on CT scan.  Pulmonary emphysema - History of pulmonary emphysema secondary to prior tobacco use. - Emphysema confirmed on previous imaging studies. - No current tobacco use.  Gastroesophageal  reflux disease (gerd) - Managed with over-the-counter omeprazole 20 mg daily as needed. - Finds omeprazole effective and cost-efficient.  Obesity and physical activity - Class 1 obesity with BMI of 31. - Works as a designer, industrial/product six days per week. - Achieves approximately 20,000 steps daily.  Insurance and healthcare access - Considering changes to insurance due to new job role without healthcare benefits.       04/29/2024    9:39 AM 10/26/2023    3:45 PM 09/18/2023   12:58 PM 11/13/2022    3:04 PM 12/07/2021    3:10 PM  Depression screen PHQ 2/9  Decreased Interest 0 0 0 0 0  Down, Depressed, Hopeless 0 0 0 0 0  PHQ - 2 Score 0 0 0 0 0  Altered sleeping 0 0  0   Tired, decreased energy 0 0  0   Change in appetite 1 0  0   Feeling bad or failure about yourself  0 0  0   Trouble concentrating 0 0  0   Moving slowly or fidgety/restless 0 0  0   Suicidal thoughts 0 0  0   PHQ-9 Score 1 0   0    Difficult doing work/chores Not difficult at all Not difficult at all  Not difficult at all      Data saved with a previous flowsheet row definition       04/29/2024    9:39 AM  GAD 7 : Generalized Anxiety Score  Nervous, Anxious, on Edge 0  Control/stop worrying 0  Worry too much - different things 0  Trouble relaxing 1  Restless 0  Easily annoyed or irritable 0  Afraid -  awful might happen 0  Total GAD 7 Score 1  Anxiety Difficulty Not difficult at all    Health Maintenance Due  Topic Date Due   Diabetic kidney evaluation - Urine ACR  Never done   OPHTHALMOLOGY EXAM  04/14/2023   HEMOGLOBIN A1C  03/19/2024   Diabetic kidney evaluation - eGFR measurement  05/07/2024     PMH:  The following were reviewed and entered/updated in epic: Past Medical History:  Diagnosis Date   Arthritis    BILATERAL   Carcinogenic effect 08/30/2021   Cataract    BILATERAL   Deaf    COCHLEAR IMPLANT REQUIRED   Explosion and rupture of other specified pressurized devices, sequela  08/30/2021   Family history of cancer 08/30/2021   GERD (gastroesophageal reflux disease)    History of hand surgery 08/30/2021   History of left hip replacement 08/30/2021   Hyperlipidemia    Hyponatremia 09/27/2021   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 08/30/2021   Perineum pain, male 08/30/2021   Polyarthralgia 01/20/2022    Patient Active Problem List   Diagnosis Date Noted   Plantar fasciitis of right foot 04/29/2024   Idiopathic chronic gout of right ankle without tophus 04/29/2024   Need for pneumococcal vaccination 06/18/2023   OSA (obstructive sleep apnea) 06/18/2023   Bradycardia 06/18/2023   Uses cochlear implant 05/29/2023   BPPV (benign paroxysmal positional vertigo), bilateral 05/29/2023   Labyrinthitis 05/17/2023   Diverticulosis 05/08/2023   Hematochezia 05/08/2023   Constipation 05/08/2023   Incomplete right bundle branch block (RBBB) determined by electrocardiography 05/01/2023   Dizziness 05/01/2023   Loud snoring 04/24/2023   GERD with apnea 04/24/2023   Obesity (BMI 35.0-39.9 without comorbidity) 04/24/2023   DM (diabetes mellitus) type II, controlled, with peripheral vascular disorder (HCC) 04/24/2023   Class 1 obesity due to excess calories with serious comorbidity and body mass index (BMI) of 30.0 to 30.9 in adult 03/20/2023   Apnea 03/20/2023   Gastroesophageal reflux disease without esophagitis 03/20/2023   Increased frequency of urination 03/20/2023   Pulmonary emphysema (HCC) 11/23/2022   COVID-19 11/23/2022   Hypertension associated with diabetes (HCC) 09/18/2022   Psoriasis 09/18/2022   History of tobacco use 09/18/2022   Hyperhidrosis 06/05/2022   Intertrigo 06/05/2022   Unknown skin lesion 06/05/2022   Type 2 diabetes mellitus with hyperglycemia (HCC) 01/22/2022   Hyperlipidemia associated with type 2 diabetes mellitus (HCC) 08/30/2021   Primary osteoarthritis involving multiple joints 08/30/2021   Primary insomnia 08/30/2021   Snoring 08/30/2021    Sensorineural hearing loss (SNHL) of both ears 02/28/2017    Past Surgical History:  Procedure Laterality Date   HAND SURGERY Bilateral    HEAD SUTURES     HIP REPLACEMENT Left    URINARY TRACT SURGERY     AS A CHILD    Family History  Problem Relation Age of Onset   Colon polyps Mother    Colon polyps Father    Cancer Father    Colon cancer Neg Hx    Crohn's disease Neg Hx    Esophageal cancer Neg Hx    Rectal cancer Neg Hx    Stomach cancer Neg Hx     Medications- reviewed and updated Outpatient Medications Prior to Visit  Medication Sig Dispense Refill   Multiple Vitamin (MULTIVITAMIN ADULT PO) Take by mouth daily. TAKE ONE DAILY     ezetimibe  (ZETIA ) 10 MG tablet Take 1 tablet (10 mg total) by mouth daily. 90 tablet 3   olmesartan  (BENICAR ) 5 MG tablet  Take 2 tablets (10 mg total) by mouth daily. 180 tablet 3   rosuvastatin  (CRESTOR ) 20 MG tablet Take 1 tablet (20 mg total) by mouth daily. 90 tablet 3   Blood Glucose Monitoring Suppl DEVI 1 each by Does not apply route in the morning, at noon, and at bedtime. May substitute to any manufacturer covered by patient's insurance. (Patient not taking: Reported on 10/26/2023) 1 each 0   esomeprazole  (NEXIUM ) 40 MG capsule TAKE 1 CAPSULE BY MOUTH ONCE DAILY IN THE MORNING 1  HOUR  BEFORE  BREAKFAST 30 capsule 0   meclizine  (ANTIVERT ) 25 MG tablet Take 0.5-1 tablets (12.5-25 mg total) by mouth 3 (three) times daily as needed for dizziness or nausea. 30 tablet 0   ZARXIO 300 MCG/0.5ML SOSY injection  (Patient not taking: Reported on 10/26/2023)     No facility-administered medications prior to visit.    Allergies[1]  Social History   Socioeconomic History   Marital status: Married    Spouse name: Not on file   Number of children: Not on file   Years of education: Not on file   Highest education level: GED or equivalent  Occupational History   Occupation: delivery driver  Tobacco Use   Smoking status: Former    Current  packs/day: 0.00    Average packs/day: 2.0 packs/day for 30.0 years (60.0 ttl pk-yrs)    Types: Cigarettes    Start date: 17    Quit date: 2022    Years since quitting: 4.0   Smokeless tobacco: Never  Vaping Use   Vaping status: Never Used  Substance and Sexual Activity   Alcohol use: Yes    Comment: occ   Drug use: Never   Sexual activity: Yes  Other Topics Concern   Not on file  Social History Narrative   Not on file   Social Drivers of Health   Tobacco Use: Medium Risk (04/29/2024)   Patient History    Smoking Tobacco Use: Former    Smokeless Tobacco Use: Never    Passive Exposure: Not on file  Financial Resource Strain: Patient Declined (04/29/2024)   Overall Financial Resource Strain (CARDIA)    Difficulty of Paying Living Expenses: Patient declined  Food Insecurity: Patient Declined (04/29/2024)   Epic    Worried About Programme Researcher, Broadcasting/film/video in the Last Year: Patient declined    Barista in the Last Year: Patient declined  Transportation Needs: No Transportation Needs (04/29/2024)   Epic    Lack of Transportation (Medical): No    Lack of Transportation (Non-Medical): No  Physical Activity: Unknown (04/29/2024)   Exercise Vital Sign    Days of Exercise per Week: 5 days    Minutes of Exercise per Session: Patient declined  Stress: Patient Declined (04/29/2024)   [employer] of Occupational Health - Occupational Stress Questionnaire    Feeling of Stress: Patient declined  Social Connections: Unknown (04/29/2024)   Social Connection and Isolation Panel    Frequency of Communication with Friends and Family: Patient declined    Frequency of Social Gatherings with Friends and Family: Patient declined    Attends Religious Services: Patient declined    Active Member of Clubs or Organizations: No    Attends Banker Meetings: Not on file    Marital Status: Married  Depression (PHQ2-9): Low Risk (04/29/2024)   Depression (PHQ2-9)    PHQ-2 Score: 1   Alcohol Screen: Low Risk (04/29/2024)   Alcohol Screen    Last Alcohol Screening  Score (AUDIT): 3  Housing: Unknown (04/29/2024)   Epic    Unable to Pay for Housing in the Last Year: No    Number of Times Moved in the Last Year: Not on file    Homeless in the Last Year: No  Utilities: Not At Risk (10/26/2023)   Epic    Threatened with loss of utilities: No  Health Literacy: Adequate Health Literacy (10/26/2023)   B1300 Health Literacy    Frequency of need for help with medical instructions: Never           Objective:  Physical Exam: BP 123/82   Pulse 64   Temp (!) 97.1 F (36.2 C)   Resp 16   Ht 5' 11 (1.803 m)   Wt 225 lb (102.1 kg)   SpO2 99%   BMI 31.38 kg/m   Body mass index is 31.38 kg/m. Wt Readings from Last 3 Encounters:  04/29/24 225 lb (102.1 kg)  09/18/23 221 lb 6.4 oz (100.4 kg)  07/16/23 221 lb (100.2 kg)   Physical Exam Constitutional:      General: He is not in acute distress.    Appearance: Normal appearance. He is not ill-appearing or toxic-appearing.  HENT:     Head: Normocephalic and atraumatic.     Right Ear: Hearing, tympanic membrane, ear canal and external ear normal. There is no impacted cerumen.     Left Ear: Hearing, tympanic membrane, ear canal and external ear normal. There is no impacted cerumen.     Nose: Nose normal. No congestion.     Mouth/Throat:     Lips: No lesions.     Mouth: Mucous membranes are moist.     Pharynx: Oropharynx is clear. No oropharyngeal exudate.  Eyes:     General: No scleral icterus.       Right eye: No discharge.        Left eye: No discharge.     Conjunctiva/sclera: Conjunctivae normal.     Pupils: Pupils are equal, round, and reactive to light.  Neck:     Thyroid : No thyroid  mass, thyromegaly or thyroid  tenderness.  Cardiovascular:     Rate and Rhythm: Normal rate and regular rhythm.     Pulses: Normal pulses.     Heart sounds: Normal heart sounds.  Pulmonary:     Effort: Pulmonary effort is  normal. No respiratory distress.     Breath sounds: Normal breath sounds.  Abdominal:     General: Abdomen is flat. Bowel sounds are normal.     Palpations: Abdomen is soft.  Musculoskeletal:        General: Normal range of motion.     Cervical back: Normal range of motion.     Right lower leg: No edema.     Left lower leg: No edema.  Lymphadenopathy:     Cervical: No cervical adenopathy.  Skin:    General: Skin is warm and dry.     Findings: No rash.  Neurological:     General: No focal deficit present.     Mental Status: He is alert and oriented to person, place, and time. Mental status is at baseline.     Deep Tendon Reflexes:     Reflex Scores:      Patellar reflexes are 2+ on the right side and 2+ on the left side. Psychiatric:        Mood and Affect: Mood normal.        Behavior: Behavior normal.  Thought Content: Thought content normal.        Judgment: Judgment normal.        Diabetic Foot Exam - Simple   Simple Foot Form Diabetic Foot exam was performed with the following findings: Yes 04/29/2024 10:05 AM  Visual Inspection No deformities, no ulcerations, no other skin breakdown bilaterally: Yes Sensation Testing Intact to touch and monofilament testing bilaterally: Yes Pulse Check Posterior Tibialis and Dorsalis pulse intact bilaterally: Yes Comments Thickening yellow nails throughout, bilateral     Prior labs:   No results found for this or any previous visit (from the past 2160 hours).  Lab Results  Component Value Date   CHOL 181 03/20/2023   CHOL 157 06/02/2022   CHOL 153 01/20/2022   Lab Results  Component Value Date   HDL 54.90 03/20/2023   HDL 54.10 06/02/2022   HDL 47.10 01/20/2022   Lab Results  Component Value Date   LDLCALC 87 03/20/2023   LDLCALC 65 06/02/2022   LDLCALC 83 01/20/2022   Lab Results  Component Value Date   TRIG 195.0 (H) 03/20/2023   TRIG 187.0 (H) 06/02/2022   TRIG 117.0 01/20/2022   Lab Results   Component Value Date   CHOLHDL 3 03/20/2023   CHOLHDL 3 06/02/2022   CHOLHDL 3 01/20/2022   No results found for: LDLDIRECT  Last metabolic panel Lab Results  Component Value Date   GLUCOSE 108 (H) 05/08/2023   NA 138 05/08/2023   K 4.2 05/08/2023   CL 103 05/08/2023   CO2 28 05/08/2023   BUN 19 05/08/2023   CREATININE 1.21 05/08/2023   GFR 66.11 05/08/2023   CALCIUM  9.9 05/08/2023   PROT 7.2 03/20/2023   ALBUMIN 4.6 03/20/2023   BILITOT 0.4 03/20/2023   ALKPHOS 90 03/20/2023   AST 18 03/20/2023   ALT 29 03/20/2023    Lab Results  Component Value Date   HGBA1C 6.9 (A) 09/18/2023    Last CBC Lab Results  Component Value Date   WBC 7.7 05/08/2023   HGB 14.2 05/08/2023   HCT 42.5 05/08/2023   MCV 93.8 05/08/2023   RDW 13.1 05/08/2023   PLT 265.0 05/08/2023    Lab Results  Component Value Date   TSH 1.68 06/02/2022    Lab Results  Component Value Date   PSA 8.88 (H) 03/20/2023   PSA 5.58 (H) 09/27/2021    Last vitamin D  No results found for: MARIEN BOLLS, VD25OH  Lab Results  Component Value Date   BILIRUBINUR NEGATIVE 01/16/2022   PROTEINUR NEGATIVE 03/20/2023   UROBILINOGEN 0.2 01/16/2022   LEUKOCYTESUR NEGATIVE 01/16/2022    No results found for: LABMICR, MICROALBUR   At today's visit, we discussed treatment options, associated risk and benefits, and engage in counseling as needed.  Additionally the following were reviewed: Past medical records, past medical and surgical history, family and social background, as well as relevant laboratory results, imaging findings, and specialty notes, where applicable.  This message was generated using dictation software, and as a result, it may contain unintentional typos or errors.  Nevertheless, extensive effort was made to accurately convey at the pertinent aspects of the patient visit.    There may have been are other unrelated non-urgent complaints, but due to the busy schedule and  the amount of time already spent with him, time does not permit to address these issues at today's visit. Another appointment may have or has been requested to review these additional issues.     Arvella Hummer,  MD, MS      [1]  Allergies Allergen Reactions   Amoxicillin Nausea Only   Metformin  And Related Diarrhea   Ozempic  (0.25 Or 0.5 Mg-Dose) [Semaglutide (0.25 Or 0.5mg -Dos)] Diarrhea   "

## 2024-04-29 NOTE — Patient Instructions (Addendum)
 It was very nice to see you today!  VISIT SUMMARY: During your visit, we discussed the management of your chronic conditions including type 2 diabetes, hypertension, hyperlipidemia, and recent right foot swelling. We also reviewed your history of gout, pulmonary emphysema, GERD, and obesity.  YOUR PLAN: TYPE 2 DIABETES MELLITUS: Your diabetes is currently managed with diet, and your last A1c was 6.9%. -We will recheck your hemoglobin A1c. -We will screen for anemia with a CBC. -We will perform a foot exam. -We will discuss a potential referral to podiatry for diabetic foot care. -I provided you with stretches for plantar fasciitis. -I recommended good insoles for walking.  ATHEROSCLEROTIC HEART DISEASE OF NATIVE CORONARY ARTERY: You have coronary artery calcification seen on a CT scan. -We will consider antiplatelet therapy such as aspirin .  HYPERTENSION: Your blood pressure is managed with olmesartan  10 mg daily. -Continue taking olmesartan  10 mg daily.  HYPERLIPIDEMIA: Your cholesterol is managed with rosuvastatin  20 mg daily and Zetia  10 mg daily. -We will check your lipid panel.  PULMONARY EMPHYSEMA: You have a history of smoking and emphysema seen on prior scans. -No changes needed at this time.  CLASS 1 OBESITY WITH COMORBIDITY: You have a BMI of 31 and comorbidities including hypertension and hyperlipidemia. -I recommended a healthy lifestyle, Mediterranean style diet, and cardiovascular activity. -I advised a low carb diet to manage diabetes and hyperlipidemia.  IDIOPATHIC CHRONIC GOUT, RIGHT ANKLE AND FOOT: You had recent right foot swelling that resolved with prednisone  and anti-inflammatory medication, possibly gout-related. -We will check your uric acid levels. -We will consider medication to prevent gout recurrence if your uric acid is high.  GASTROESOPHAGEAL REFLUX DISEASE: Your GERD is managed with over-the-counter omeprazole 20 mg daily as needed. -Continue taking  over-the-counter omeprazole 20 mg daily as needed.  ONYCHOMYCOSIS: You have yellowing and thickening of your nails, likely mild nail fungus. -We discussed potential treatment options for nail fungus.  PLANTAR FASCIITIS: You have mild plantar fasciitis likely due to increased walking. -I provided you with stretches for plantar fasciitis. -I recommended good insoles for walking.  GENERAL HEALTH MAINTENANCE: You are due for renal evaluation and prostate cancer screening. -We ordered a complete metabolic panel for renal evaluation. -We will screen for malignant neoplasm of the prostate.  No follow-ups on file.   Take care, Arvella Hummer, MD, MS   PLEASE NOTE:  If you had any lab tests, please let us  know if you have not heard back within a few days. You may see your results on mychart before we have a chance to review them but we will give you a call once they are reviewed by us .   If we ordered any referrals today, please let us  know if you have not heard from their office within the next week.   If you had any urgent prescriptions sent in today, please check with the pharmacy within an hour of our visit to make sure the prescription was transmitted appropriately.   Please try these tips to maintain a healthy lifestyle:  Eat at least 3 REAL meals and 1-2 snacks per day.  Aim for no more than 5 hours between eating.  If you eat breakfast, please do so within one hour of getting up.   Each meal should contain half fruits/vegetables, one quarter protein, and one quarter carbs (no bigger than a computer mouse)  Cut down on sweet beverages. This includes juice, soda, and sweet tea.   Drink at least 1 glass of water with  each meal and aim for at least 8 glasses per day  Exercise at least 150 minutes every week.

## 2024-04-30 NOTE — Progress Notes (Signed)
 Last read by Lynwood MARLA Delores Mickey. at 6:34PM on 04/29/2024.

## 2024-05-15 ENCOUNTER — Ambulatory Visit (INDEPENDENT_AMBULATORY_CARE_PROVIDER_SITE_OTHER)

## 2024-05-15 ENCOUNTER — Ambulatory Visit

## 2024-05-15 DIAGNOSIS — M778 Other enthesopathies, not elsewhere classified: Secondary | ICD-10-CM

## 2024-05-15 DIAGNOSIS — M205X1 Other deformities of toe(s) (acquired), right foot: Secondary | ICD-10-CM | POA: Diagnosis not present

## 2024-05-15 DIAGNOSIS — G5751 Tarsal tunnel syndrome, right lower limb: Secondary | ICD-10-CM

## 2024-05-15 DIAGNOSIS — E1151 Type 2 diabetes mellitus with diabetic peripheral angiopathy without gangrene: Secondary | ICD-10-CM | POA: Diagnosis not present
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
## 2024-08-15 ENCOUNTER — Ambulatory Visit

## 2024-10-27 ENCOUNTER — Ambulatory Visit: Admitting: Family Medicine

## 2024-11-03 ENCOUNTER — Ambulatory Visit

## 2024-12-03 ENCOUNTER — Ambulatory Visit: Admitting: Physician Assistant
